# Patient Record
Sex: Female | Born: 1945
Health system: Southern US, Community
[De-identification: ages and names within clinical notes are randomized; demographics above are authoritative.]

## PROBLEM LIST (undated history)

## (undated) DIAGNOSIS — R011 Cardiac murmur, unspecified: Secondary | ICD-10-CM

## (undated) DIAGNOSIS — M1990 Unspecified osteoarthritis, unspecified site: Secondary | ICD-10-CM

## (undated) DIAGNOSIS — G43909 Migraine, unspecified, not intractable, without status migrainosus: Secondary | ICD-10-CM

## (undated) DIAGNOSIS — Z8619 Personal history of other infectious and parasitic diseases: Secondary | ICD-10-CM

## (undated) HISTORY — DX: Unspecified osteoarthritis, unspecified site: M19.90

## (undated) HISTORY — DX: Migraine, unspecified, not intractable, without status migrainosus: G43.909

## (undated) HISTORY — PX: ABDOMINAL HYSTERECTOMY: SHX81

## (undated) HISTORY — PX: FOOT SURGERY: SHX648

## (undated) HISTORY — PX: NECK SURGERY: SHX720

## (undated) HISTORY — DX: Cardiac murmur, unspecified: R01.1

## (undated) HISTORY — PX: APPENDECTOMY: SHX54

## (undated) HISTORY — PX: BACK SURGERY: SHX140

## (undated) HISTORY — PX: REPLACEMENT TOTAL KNEE: SUR1224

## (undated) HISTORY — DX: Personal history of other infectious and parasitic diseases: Z86.19

## (undated) HISTORY — PX: BREAST SURGERY: SHX581

---

## 2016-09-03 DIAGNOSIS — H40033 Anatomical narrow angle, bilateral: Secondary | ICD-10-CM | POA: Diagnosis not present

## 2016-09-03 DIAGNOSIS — H2513 Age-related nuclear cataract, bilateral: Secondary | ICD-10-CM | POA: Diagnosis not present

## 2016-09-06 ENCOUNTER — Ambulatory Visit: Payer: Self-pay | Admitting: Family

## 2016-09-11 ENCOUNTER — Encounter: Payer: Self-pay | Admitting: Family

## 2016-09-11 ENCOUNTER — Ambulatory Visit (INDEPENDENT_AMBULATORY_CARE_PROVIDER_SITE_OTHER): Payer: Medicare Other | Admitting: Family

## 2016-09-11 DIAGNOSIS — M5441 Lumbago with sciatica, right side: Secondary | ICD-10-CM

## 2016-09-11 DIAGNOSIS — M542 Cervicalgia: Secondary | ICD-10-CM

## 2016-09-11 DIAGNOSIS — G43909 Migraine, unspecified, not intractable, without status migrainosus: Secondary | ICD-10-CM | POA: Insufficient documentation

## 2016-09-11 DIAGNOSIS — M545 Low back pain, unspecified: Secondary | ICD-10-CM | POA: Insufficient documentation

## 2016-09-11 DIAGNOSIS — G43009 Migraine without aura, not intractable, without status migrainosus: Secondary | ICD-10-CM

## 2016-09-11 DIAGNOSIS — G8929 Other chronic pain: Secondary | ICD-10-CM

## 2016-09-11 MED ORDER — DULOXETINE HCL 30 MG PO CPEP: ORAL_CAPSULE | ORAL | 1 refills | Status: DC

## 2016-09-11 MED ORDER — DICLOFENAC SODIUM 1 % TD GEL: 2.0000 g | Freq: Four times a day (QID) | TRANSDERMAL | 2 refills | Status: DC

## 2016-09-11 MED ORDER — TRAMADOL HCL 50 MG PO TABS: 50.0000 mg | ORAL_TABLET | Freq: Four times a day (QID) | ORAL | 0 refills | Status: DC | PRN

## 2016-09-11 MED ORDER — TIZANIDINE HCL 4 MG PO TABS: 4.0000 mg | ORAL_TABLET | Freq: Four times a day (QID) | ORAL | 0 refills | Status: DC | PRN

## 2016-09-11 MED ORDER — METHYLPREDNISOLONE ACETATE 80 MG/ML IJ SUSP
80.0000 mg | Freq: Once | INTRAMUSCULAR | Status: AC
  Administered 2016-09-11: 80 mg via INTRAMUSCULAR

## 2016-09-11 MED ORDER — KETOROLAC TROMETHAMINE 60 MG/2ML IM SOLN
60.0000 mg | Freq: Once | INTRAMUSCULAR | Status: AC
  Administered 2016-09-11: 60 mg via INTRAMUSCULAR

## 2016-09-11 NOTE — Assessment & Plan Note (Signed)
Migraine headaches appear adequately controlled and frequent. Information discussed regarding possible triggers. Encouraged to monitor signs and symptoms in relation to possible trigger factors. Denies worse headache of life Continue over-the-counter medications as needed for symptom relief and supportive care.

## 2016-09-11 NOTE — Patient Instructions (Addendum)
Thank you for choosing [facility].  SUMMARY AND INSTRUCTIONS:  Ice and moist heat x 20 minutes every other hour and after activity.  Stretches and activities multiple times throughout the day.  Increase physical activity with at least walking.   Medication:  Start taking the Cymbalta to help with pain. Start with 30 mg daily for 1 week then increase to 60 mg daily.  Start Tramadol as needed for pain.  Start Voltaren gel as needed.   Your prescription(s) have been submitted to your pharmacy or been printed and provided for you. Please take as directed and contact our office if you believe you are having problem(s) with the medication(s) or have any questions.  Labs:  Please stop by the lab on the lower level of the building for your blood work. Your results will be released to MyChart (or called to you) after review, usually within 72 hours after test completion. If any changes need to be made, you will be notified at that same time.  1.) The lab is open from 7:30am to 5:30 pm Monday-Friday 2.) No appointment is necessary 3.) Fasting (if needed) is 6-8 hours after food and drink; black coffee and water are okay   Imaging / Radiology:  Please stop by radiology on the basement level of the building for your x-rays. Your results will be released to MyChart (or called to you) after review, usually within 72 hours after test completion. If any treatments or changes are necessary, you will be notified at that same time.  Referrals:  Referrals have been made during this visit. You should expect to hear back from our schedulers in about 7-10 days in regards to establishing an appointment with the specialists we discussed.   Follow up:  If your symptoms worsen or fail to improve, please contact our office for further instruction, or in case of emergency go directly to the emergency room at the closest medical facility.     Cervical Strain and Sprain Rehab Ask your health care  provider which exercises are safe for you. Do exercises exactly as told by your health care provider and adjust them as directed. It is normal to feel mild stretching, pulling, tightness, or discomfort as you do these exercises, but you should stop right away if you feel sudden pain or your pain gets worse.Do not begin these exercises until told by your health care provider. Stretching and range of motion exercises These exercises warm up your muscles and joints and improve the movement and flexibility of your neck. These exercises also help to relieve pain, numbness, and tingling. Exercise A: Cervical side bend 1. Using good posture, sit on a stable chair or stand up. 2. Without moving your shoulders, slowly tilt your left / right ear to your shoulder until you feel a stretch in your neck muscles. You should be looking straight ahead. 3. Hold for __________ seconds. 4. Repeat with the other side of your neck. Repeat __________ times. Complete this exercise __________ times a day. Exercise B: Cervical rotation 1. Using good posture, sit on a stable chair or stand up. 2. Slowly turn your head to the side as if you are looking over your left / right shoulder.  Keep your eyes level with the ground.  Stop when you feel a stretch along the side and the back of your neck. 3. Hold for __________ seconds. 4. Repeat this by turning to your other side. Repeat __________ times. Complete this exercise __________ times a day. Exercise C:  Thoracic extension and pectoral stretch 1. Roll a towel or a small blanket so it is about 4 inches (10 cm) in diameter. 2. Lie down on your back on a firm surface. 3. Put the towel lengthwise, under your spine in the middle of your back. It should not be not under your shoulder blades. The towel should line up with your spine from your middle back to your lower back. 4. Put your hands behind your head and let your elbows fall out to your sides. 5. Hold for __________  seconds. Repeat __________ times. Complete this exercise __________ times a day. Strengthening exercises These exercises build strength and endurance in your neck. Endurance is the ability to use your muscles for a long time, even after your muscles get tired. Exercise D: Upper cervical flexion, isometric 1. Lie on your back with a thin pillow behind your head and a small rolled-up towel under your neck. 2. Gently tuck your chin toward your chest and nod your head down to look toward your feet. Do not lift your head off the pillow. 3. Hold for __________ seconds. 4. Release the tension slowly. Relax your neck muscles completely before you repeat this exercise. Repeat __________ times. Complete this exercise __________ times a day. Exercise E: Cervical extension, isometric 1. Stand about 6 inches (15 cm) away from a wall, with your back facing the wall. 2. Place a soft object, about 6-8 inches (15-20 cm) in diameter, between the back of your head and the wall. A soft object could be a small pillow, a ball, or a folded towel. 3. Gently tilt your head back and press into the soft object. Keep your jaw and forehead relaxed. 4. Hold for __________ seconds. 5. Release the tension slowly. Relax your neck muscles completely before you repeat this exercise. Repeat __________ times. Complete this exercise __________ times a day. Posture and body mechanics   Body mechanics refers to the movements and positions of your body while you do your daily activities. Posture is part of body mechanics. Good posture and healthy body mechanics can help to relieve stress in your body's tissues and joints. Good posture means that your spine is in its natural S-curve position (your spine is neutral), your shoulders are pulled back slightly, and your head is not tipped forward. The following are general guidelines for applying improved posture and body mechanics to your everyday activities. Standing  When standing, keep  your spine neutral and keep your feet about hip-width apart. Keep a slight bend in your knees. Your ears, shoulders, and hips should line up.  When you do a task in which you stand in one place for a long time, place one foot up on a stable object that is 2-4 inches (5-10 cm) high, such as a footstool. This helps keep your spine neutral. Sitting  When sitting, keep your spine neutral and your keep feet flat on the floor. Use a footrest, if necessary, and keep your thighs parallel to the floor. Avoid rounding your shoulders, and avoid tilting your head forward.  When working at a desk or a computer, keep your desk at a height where your hands are slightly lower than your elbows. Slide your chair under your desk so you are close enough to maintain good posture.  When working at a computer, place your monitor at a height where you are looking straight ahead and you do not have to tilt your head forward or downward to look at the screen. Resting When lying  down and resting, avoid positions that are most painful for you. Try to support your neck in a neutral position. You can use a contour pillow or a small rolled-up towel. Your pillow should support your neck but not push on it. This information is not intended to replace advice given to you by your health care provider. Make sure you discuss any questions you have with your health care provider. Document Released: 08/06/2005 Document Revised: 04/12/2016 Document Reviewed: 07/13/2015 Elsevier Interactive Patient Education  2017 [employer]   Low Back Strain Rehab Ask your health care provider which exercises are safe for you. Do exercises exactly as told by your health care provider and adjust them as directed. It is normal to feel mild stretching, pulling, tightness, or discomfort as you do these exercises, but you should stop right away if you feel sudden pain or your pain gets worse. Do not begin these exercises until told by your health care  provider. Stretching and range of motion exercises These exercises warm up your muscles and joints and improve the movement and flexibility of your back. These exercises also help to relieve pain, numbness, and tingling. Exercise A: Single knee to chest 1. Lie on your back on a firm surface with both legs straight. 2. Bend one of your knees. Use your hands to move your knee up toward your chest until you feel a gentle stretch in your lower back and buttock.  Hold your leg in this position by holding onto the front of your knee.  Keep your other leg as straight as possible. 3. Hold for __________ seconds. 4. Slowly return to the starting position. 5. Repeat with your other leg. Repeat __________ times. Complete this exercise __________ times a day. Exercise B: Prone extension on elbows 5. Lie on your abdomen on a firm surface. 6. Prop yourself up on your elbows. 7. Use your arms to help lift your chest up until you feel a gentle stretch in your abdomen and your lower back.  This will place some of your body weight on your elbows. If this is uncomfortable, try stacking pillows under your chest.  Your hips should stay down, against the surface that you are lying on. Keep your hip and back muscles relaxed. 8. Hold for __________ seconds. 9. Slowly relax your upper body and return to the starting position. Repeat __________ times. Complete this exercise __________ times a day. Strengthening exercises These exercises build strength and endurance in your back. Endurance is the ability to use your muscles for a long time, even after they get tired. Exercise C: Pelvic tilt 1. Lie on your back on a firm surface. Bend your knees and keep your feet flat. 2. Tense your abdominal muscles. Tip your pelvis up toward the ceiling and flatten your lower back into the floor.  To help with this exercise, you may place a small towel under your lower back and try to push your back into the towel. 3. Hold  for __________ seconds. 4. Let your muscles relax completely before you repeat this exercise. Repeat __________ times. Complete this exercise __________ times a day. Exercise D: Alternating arm and leg raises 1. Get on your hands and knees on a firm surface. If you are on a hard floor, you may want to use padding to cushion your knees, such as an exercise mat. 2. Line up your arms and legs. Your hands should be below your shoulders, and your knees should be below your hips. 3. Lift your left  leg behind you. At the same time, raise your right arm and straighten it in front of you.  Do not lift your leg higher than your hip.  Do not lift your arm higher than your shoulder.  Keep your abdominal and back muscles tight.  Keep your hips facing the ground.  Do not arch your back.  Keep your balance carefully, and do not hold your breath. 4. Hold for __________ seconds. 5. Slowly return to the starting position and repeat with your right leg and your left arm. Repeat __________ times. Complete this exercise __________times a day. Exercise J: Single leg lower with bent knees 1. Lie on your back on a firm surface. 2. Tense your abdominal muscles and lift your feet off the floor, one foot at a time, so your knees and hips are bent in an "L" shape (at about 90 degrees).  Your knees should be over your hips and your lower legs should be parallel to the floor. 3. Keeping your abdominal muscles tense and your knee bent, slowly lower one of your legs so your toe touches the ground. 4. Lift your leg back up to return to the starting position.  Do not hold your breath.  Do not let your back arch. Keep your back flat against the ground. 5. Repeat with your other leg. Repeat __________ times. Complete this exercise __________ times a day. Posture and body mechanics   Body mechanics refers to the movements and positions of your body while you do your daily activities. Posture is part of body  mechanics. Good posture and healthy body mechanics can help to relieve stress in your body's tissues and joints. Good posture means that your spine is in its natural S-curve position (your spine is neutral), your shoulders are pulled back slightly, and your head is not tipped forward. The following are general guidelines for applying improved posture and body mechanics to your everyday activities. Standing   When standing, keep your spine neutral and your feet about hip-width apart. Keep a slight bend in your knees. Your ears, shoulders, and hips should line up.  When you do a task in which you stand in one place for a long time, place one foot up on a stable object that is 2-4 inches (5-10 cm) high, such as a footstool. This helps keep your spine neutral. Sitting  When sitting, keep your spine neutral and keep your feet flat on the floor. Use a footrest, if necessary, and keep your thighs parallel to the floor. Avoid rounding your shoulders, and avoid tilting your head forward.  When working at a desk or a computer, keep your desk at a height where your hands are slightly lower than your elbows. Slide your chair under your desk so you are close enough to maintain good posture.  When working at a computer, place your monitor at a height where you are looking straight ahead and you do not have to tilt your head forward or downward to look at the screen. Resting   When lying down and resting, avoid positions that are most painful for you.  If you have pain with activities such as sitting, bending, stooping, or squatting (flexion-based activities), lie in a position in which your body does not bend very much. For example, avoid curling up on your side with your arms and knees near your chest (fetal position).  If you have pain with activities such as standing for a long time or reaching with your arms (extension-based activities), lie  with your spine in a neutral position and bend your knees  slightly. Try the following positions:  Lying on your side with a pillow between your knees.  Lying on your back with a pillow under your knees. Lifting   When lifting objects, keep your feet at least shoulder-width apart and tighten your abdominal muscles.  Bend your knees and hips and keep your spine neutral. It is important to lift using the strength of your legs, not your back. Do not lock your knees straight out.  Always ask for help to lift heavy or awkward objects. This information is not intended to replace advice given to you by your health care provider. Make sure you discuss any questions you have with your health care provider. Document Released: 08/06/2005 Document Revised: 04/12/2016 Document Reviewed: 05/18/2015 Elsevier Interactive Patient Education  2017 [employer].

## 2016-09-11 NOTE — Assessment & Plan Note (Addendum)
Low back pain appears to be paraspinal musculature secondary to tightness and decreased mobility. In office injection of 60 mg of Toradol and 80 mg of Depo-Medrol provided. Recommended increasing physical activity to 30 minutes of moderate level activity daily. Start tizanidine as needed for muscle spasm. Start Cymbalta for chronic pain and neuropathies. Start Voltaren gel as needed. If symptoms worsen or do not improve consider additional imaging and/or formal physical therapy. Patient reiterated on several occasions that the only thing that helps her pain as Percocet and fentanyl patches.

## 2016-09-11 NOTE — Assessment & Plan Note (Signed)
Chronic neck pain status post fusion surgery with severely limited range of motion and lateral bending and rotation. This is most likely related to decreased physical activity. Treat conservatively with ice, home exercise therapy, and start tramadol as needed for pain. Start Cymbalta. Start Zanaflex as needed for muscle spasms. Follow-up in 3 weeks or sooner if symptoms do not improve for possible further imaging and/or referral to pain management.

## 2016-09-11 NOTE — Progress Notes (Signed)
Subjective:    Patient ID: Gina Higgins, female    DOB: 13-Nov-1945, 71 y.o.   MRN: [id number]  Chief Complaint  Patient presents with  . Establish Care    states she has body pain, it has been going on for a while    HPI:  Gina Higgins is a 71 y.o. female who  has a past medical history of Arthritis; Heart murmur; History of chicken pox; and Migraines. and presents today for an office visit to establish care.   1.) Body pain - Associated symptom of pain located generalized throughout her body has been going on for at least a year. Pain is worst at her neck and described as achy and notes that she has decreased range of motion. Modifying factors include taking another person's prescription Percocet which seems to help. She has also used Voltaren cream which seemed to help a little as well. Last physical therapy was years ago. Her back pain is described as sharp at times and generalized and achy.  Pain can radiate down her right leg. Has had previous surgery. Pain severity is enough to effect her activities of daily living, however she is able to complete them at a slower pace. Has limited exercise. Previous medications include Fentanyl patches which help on occasion   2.) Migraine headaches - Previously diagnosed with migraine headaches which she indicates are stable presently with a frequency of 1x every 3 months. Described as achy with sensitivity to light and sound without nausea or vomiting. Denies worst headache of life. No aura before headaches. There are no trigger factors that she is aware of. Modifying factors include Excedrin.  Allergies  Allergen Reactions  . Compazine [Prochlorperazine Edisylate]     Lock jaw     No outpatient prescriptions prior to visit.   No facility-administered medications prior to visit.      Past Medical History:  Diagnosis Date  . Arthritis   . Heart murmur   . History of chicken pox   . Migraines       Past Surgical History:    Procedure Laterality Date  . ABDOMINAL HYSTERECTOMY     Partial  . APPENDECTOMY    . BACK SURGERY    . BREAST SURGERY     Total mastectomy  . FOOT SURGERY    . NECK SURGERY    . REPLACEMENT TOTAL KNEE Right       Family History  Problem Relation Age of Onset  . Hypertension Mother   . Breast cancer Mother   . Gout Father   . Hypertension Father   . Hypertension Maternal Grandmother   . Hypertension Maternal Grandfather       Social History   Social History  . Marital status: Single    Spouse name: N/A  . Number of children: 4  . Years of education: 12   Occupational History  . Retired    Social History Main Topics  . Smoking status: Former Smoker    Packs/day: 1.00    Years: 50.00    Types: Cigarettes    Quit date: 08/13/2016  . Smokeless tobacco: Never Used  . Alcohol use 1.8 oz/week    2 Glasses of wine, 1 Cans of beer per week  . Drug use: Unknown  . Sexual activity: Not on file   Other Topics Concern  . Not on file   Social History Narrative   Fun: "Can't do anything because of pain." Likes to bowl  Review of Systems  Constitutional: Negative for chills and fever.  Respiratory: Negative for chest tightness and shortness of breath.   Cardiovascular: Negative for chest pain, palpitations and leg swelling.  Musculoskeletal: Positive for arthralgias, back pain, myalgias, neck pain and neck stiffness.  Neurological: Positive for headaches. Negative for weakness and numbness.       Objective:    BP (!) 146/84 (BP Location: Left Arm, Patient Position: Sitting, Cuff Size: Normal)   Temp 98.6 F (37 C) (Oral)   Resp 14   Ht 5\' 4"  (1.626 m)   Wt 148 lb (67.1 kg)   BMI 25.40 kg/m  Nursing note and vital signs reviewed.  Physical Exam  Constitutional: She is oriented to person, place, and time. She appears well-developed and well-nourished. No distress.  Neck:  Deformity, discoloration, or edema. There is significant tenderness of the  bilateral paraspinal musculature. Range of motion is severely restricted and lateral bending and rotation. Distal pulses and sensation are intact and appropriate. Negative cervical compression test.  Cardiovascular: Normal rate, regular rhythm, normal heart sounds and intact distal pulses.   Pulmonary/Chest: Effort normal and breath sounds normal.  Musculoskeletal:  Low back - no obvious deformity, discoloration, or edema. Palpable tenderness along lumbar spinous processes of L3-L5 and corresponding paraspinal musculature. No crepitus or deformity noted. There is significant decreased range of motion in forward flexion and rotation. Distal pulses and sensation are intact and appropriate. Negative straight leg raise. Unable to complete Pearlean Brownie secondary to range of motion.   Neurological: She is alert and oriented to person, place, and time.  Skin: Skin is warm and dry.  Psychiatric: She has a normal mood and affect. Her behavior is normal. Judgment and thought content normal.        Assessment & Plan:   Problem List Items Addressed This Visit      Cardiovascular and Mediastinum   Migraine    Migraine headaches appear adequately controlled and frequent. Information discussed regarding possible triggers. Encouraged to monitor signs and symptoms in relation to possible trigger factors. Denies worse headache of life Continue over-the-counter medications as needed for symptom relief and supportive care.      Relevant Medications   traMADol (ULTRAM) 50 MG tablet   tiZANidine (ZANAFLEX) 4 MG tablet   DULoxetine (CYMBALTA) 30 MG capsule   ketorolac (TORADOL) injection 60 mg (Completed)     Other   Chronic neck pain    Chronic neck pain status post fusion surgery with severely limited range of motion and lateral bending and rotation. This is most likely related to decreased physical activity. Treat conservatively with ice, home exercise therapy, and start tramadol as needed for pain. Start  Cymbalta. Start Zanaflex as needed for muscle spasms. Follow-up in 3 weeks or sooner if symptoms do not improve for possible further imaging and/or referral to pain management.       Relevant Medications   traMADol (ULTRAM) 50 MG tablet   tiZANidine (ZANAFLEX) 4 MG tablet   DULoxetine (CYMBALTA) 30 MG capsule   diclofenac sodium (VOLTAREN) 1 % GEL   methylPREDNISolone acetate (DEPO-MEDROL) injection 80 mg (Completed)   ketorolac (TORADOL) injection 60 mg (Completed)   Low back pain    Low back pain appears to be paraspinal musculature secondary to tightness and decreased mobility. In office injection of 60 mg of Toradol and 80 mg of Depo-Medrol provided. Recommended increasing physical activity to 30 minutes of moderate level activity daily. Start tizanidine as needed for muscle spasm. Start Cymbalta  for chronic pain and neuropathies. Start Voltaren gel as needed. If symptoms worsen or do not improve consider additional imaging and/or formal physical therapy. Patient reiterated on several occasions that the only thing that helps her pain as Percocet and fentanyl patches.      Relevant Medications   traMADol (ULTRAM) 50 MG tablet   tiZANidine (ZANAFLEX) 4 MG tablet   DULoxetine (CYMBALTA) 30 MG capsule   diclofenac sodium (VOLTAREN) 1 % GEL   methylPREDNISolone acetate (DEPO-MEDROL) injection 80 mg (Completed)   ketorolac (TORADOL) injection 60 mg (Completed)       I am having Gina Higgins start on traMADol. I am also having her maintain her tiZANidine, DULoxetine, and diclofenac sodium. We administered methylPREDNISolone acetate and ketorolac.   Meds ordered this encounter  Medications  . DISCONTD: DULoxetine (CYMBALTA) 30 MG capsule    Sig: Take 1 tablet by mouth daily for 1 week and then increase to 2 tablets daily.    Dispense:  60 capsule    Refill:  1    Order Specific Question:   Supervising Provider    Answer:   Hillard Danker A [[id number]]  . DISCONTD: tiZANidine (ZANAFLEX)  4 MG tablet    Sig: Take 1 tablet (4 mg total) by mouth every 6 (six) hours as needed for muscle spasms.    Dispense:  90 tablet    Refill:  0    Order Specific Question:   Supervising Provider    Answer:   Hillard Danker A [[id number]]  . traMADol (ULTRAM) 50 MG tablet    Sig: Take 1-2 tablets (50-100 mg total) by mouth every 6 (six) hours as needed.    Dispense:  120 tablet    Refill:  0    Order Specific Question:   Supervising Provider    Answer:   Hillard Danker A [[id number]]  . DISCONTD: diclofenac sodium (VOLTAREN) 1 % GEL    Sig: Apply 2 g topically 4 (four) times daily.    Dispense:  100 g    Refill:  2    Order Specific Question:   Supervising Provider    Answer:   Hillard Danker A [[id number]]  . tiZANidine (ZANAFLEX) 4 MG tablet    Sig: Take 1 tablet (4 mg total) by mouth every 6 (six) hours as needed for muscle spasms.    Dispense:  90 tablet    Refill:  0    Order Specific Question:   Supervising Provider    Answer:   Hillard Danker A [[id number]]  . DULoxetine (CYMBALTA) 30 MG capsule    Sig: Take 1 tablet by mouth daily for 1 week and then increase to 2 tablets daily.    Dispense:  60 capsule    Refill:  1    Order Specific Question:   Supervising Provider    Answer:   Hillard Danker A [[id number]]  . diclofenac sodium (VOLTAREN) 1 % GEL    Sig: Apply 2 g topically 4 (four) times daily.    Dispense:  100 g    Refill:  2    Order Specific Question:   Supervising Provider    Answer:   Hillard Danker A [[id number]]  . methylPREDNISolone acetate (DEPO-MEDROL) injection 80 mg  . ketorolac (TORADOL) injection 60 mg     Follow-up: Return in about 1 month (around 10/12/2016), or if symptoms worsen or fail to improve.  Jeanine Luz, FNP

## 2016-09-17 ENCOUNTER — Telehealth: Payer: Self-pay

## 2016-09-17 NOTE — Telephone Encounter (Signed)
PA initiated for diclofenac gel. Waiting approval

## 2016-10-12 ENCOUNTER — Encounter: Payer: Self-pay | Admitting: Family

## 2016-10-12 ENCOUNTER — Ambulatory Visit (INDEPENDENT_AMBULATORY_CARE_PROVIDER_SITE_OTHER): Payer: Medicare Other | Admitting: Family

## 2016-10-12 DIAGNOSIS — G8929 Other chronic pain: Secondary | ICD-10-CM | POA: Diagnosis not present

## 2016-10-12 DIAGNOSIS — M5441 Lumbago with sciatica, right side: Secondary | ICD-10-CM | POA: Diagnosis not present

## 2016-10-12 DIAGNOSIS — M542 Cervicalgia: Secondary | ICD-10-CM

## 2016-10-12 MED ORDER — ACETAMINOPHEN-CODEINE #3 300-30 MG PO TABS: 1.0000 | ORAL_TABLET | Freq: Four times a day (QID) | ORAL | 0 refills | Status: DC | PRN

## 2016-10-12 MED ORDER — DULOXETINE HCL 60 MG PO CPEP: ORAL_CAPSULE | ORAL | 0 refills | Status: DC

## 2016-10-12 NOTE — Assessment & Plan Note (Signed)
Continues to experience low back pain that is refractory to tramadol which she expresses itching is now place on her allergy list. Indicates she has been compliant with home exercise therapy, ice, and heat. Describes decreased mobility with current pain rating 9/10. Continues to request opioid medications and also became irate that her pain was not being treated. Noted to be contradictory with discussion of Lyrica indicating she had not taking it and then stating she had taken it. Continue conservative treatment with ice/moist, home exercise therapy and increase Cymbalta and start Tylenol 3. Recommend physical therapy which patient indicates she cannot afford. Discussed risks associated with opioid medications. Continue to monitor pending trial of new medications.

## 2016-10-12 NOTE — Patient Instructions (Addendum)
Thank you for choosing [facility].  SUMMARY AND INSTRUCTIONS:  Please start Tylenol #3 and increase Cymbalta.  Continue with home exercise therapy.  A referral has been made to pain management.  Please obtain old records for review.   Medication:  Your prescription(s) have been submitted to your pharmacy or been printed and provided for you. Please take as directed and contact our office if you believe you are having problem(s) with the medication(s) or have any questions.  Follow up:  If your symptoms worsen or fail to improve, please contact our office for further instruction, or in case of emergency go directly to the emergency room at the closest medical facility.

## 2016-10-12 NOTE — Progress Notes (Signed)
Subjective:    Patient ID: Gina Higgins, female    DOB: 07/06/1946, 71 y.o.   MRN: [id number]  Chief Complaint  Patient presents with  . Follow-up    still having body pain and is allergic to tramadol    HPI:  Gina Higgins is a 71 y.o. female who  has a past medical history of Arthritis; Heart murmur; History of chicken pox; and Migraines. and presents today for a follow up office visit.   Chronic neck and back pain - Recently started on Cymbalta and Zanaflex. Reports taking the medication as prescribed and denies adverse side effects. Reports also taking the Tramadol which resulted in her scratching and has been placed on her allergies list. Notes that she has been doing the exercises and ice and heat. Notes that her whole body "reaks with pain."  Inidicates that she has decreased ability to complete her activities of daily living but needs assistance on occasion. Doing pretty good with stretches and exercises. Pains are described as achy and sharp on occasion with a severity of 9/10. Reports that she has been on gabapentin in the past and does nothing. th  Allergies  Allergen Reactions  . Compazine [Prochlorperazine Edisylate]     Lock jaw  . Tramadol Itching      Outpatient Medications Prior to Visit  Medication Sig Dispense Refill  . diclofenac sodium (VOLTAREN) 1 % GEL Apply 2 g topically 4 (four) times daily. 100 g 2  . tiZANidine (ZANAFLEX) 4 MG tablet Take 1 tablet (4 mg total) by mouth every 6 (six) hours as needed for muscle spasms. 90 tablet 0  . DULoxetine (CYMBALTA) 30 MG capsule Take 1 tablet by mouth daily for 1 week and then increase to 2 tablets daily. 60 capsule 1  . traMADol (ULTRAM) 50 MG tablet Take 1-2 tablets (50-100 mg total) by mouth every 6 (six) hours as needed. 120 tablet 0   No facility-administered medications prior to visit.       Past Surgical History:  Procedure Laterality Date  . ABDOMINAL HYSTERECTOMY     Partial  . APPENDECTOMY      . BACK SURGERY    . BREAST SURGERY     Total mastectomy  . FOOT SURGERY    . NECK SURGERY    . REPLACEMENT TOTAL KNEE Right       Past Medical History:  Diagnosis Date  . Arthritis   . Heart murmur   . History of chicken pox   . Migraines       Review of Systems  Constitutional: Negative for chills and fever.  Respiratory: Negative for chest tightness and shortness of breath.   Musculoskeletal: Positive for back pain, neck pain and neck stiffness.  Neurological: Negative for weakness and numbness.      Objective:    BP 108/78 (BP Location: Left Arm, Patient Position: Sitting, Cuff Size: Normal)   Pulse 73   Temp 98.4 F (36.9 C) (Oral)   Resp 18   Ht 5\' 4"  (1.626 m)   Wt 151 lb (68.5 kg)   SpO2 90%   BMI 25.92 kg/m  Nursing note and vital signs reviewed.  Physical Exam  Constitutional: She is oriented to person, place, and time. She appears well-developed and well-nourished. No distress.  Cardiovascular: Normal rate, regular rhythm, normal heart sounds and intact distal pulses.   Pulmonary/Chest: Effort normal and breath sounds normal.  Musculoskeletal:  Chronic neck/back pain - ambulated around the office well and seated in  the chair in no apparent distress. Exams otherwise unchanged since previous.  Neurological: She is alert and oriented to person, place, and time.  Skin: Skin is warm and dry.  Psychiatric: She has a normal mood and affect. Her behavior is normal. Judgment and thought content normal.       Assessment & Plan:   Problem List Items Addressed This Visit      Other   Chronic neck pain    See discussion under low back pain. Referral placed to pain management for further assessment and treatment. Continue conservative treatment with ice, moist heat, increased Cymbalta and start Tylenol 3. Declines physical therapy. Attempting to obtain previous records. Continue to monitor.      Relevant Medications   acetaminophen-codeine (TYLENOL #3)  300-30 MG tablet   DULoxetine (CYMBALTA) 60 MG capsule   Other Relevant Orders   Ambulatory referral to Pain Clinic   Low back pain    Continues to experience low back pain that is refractory to tramadol which she expresses itching is now place on her allergy list. Indicates she has been compliant with home exercise therapy, ice, and heat. Describes decreased mobility with current pain rating 9/10. Continues to request opioid medications and also became irate that her pain was not being treated. Noted to be contradictory with discussion of Lyrica indicating she had not taking it and then stating she had taken it. Continue conservative treatment with ice/moist, home exercise therapy and increase Cymbalta and start Tylenol 3. Recommend physical therapy which patient indicates she cannot afford. Discussed risks associated with opioid medications. Continue to monitor pending trial of new medications.      Relevant Medications   acetaminophen-codeine (TYLENOL #3) 300-30 MG tablet   DULoxetine (CYMBALTA) 60 MG capsule   Other Relevant Orders   Ambulatory referral to Pain Clinic       I have discontinued Ms. Karras's traMADol. I have also changed her DULoxetine. Additionally, I am having her start on acetaminophen-codeine. Lastly, I am having her maintain her tiZANidine and diclofenac sodium.   Meds ordered this encounter  Medications  . acetaminophen-codeine (TYLENOL #3) 300-30 MG tablet    Sig: Take 1-2 tablets by mouth every 6 (six) hours as needed for moderate pain.    Dispense:  112 tablet    Refill:  0    Order Specific Question:   Supervising Provider    Answer:   Hillard DankerRAWFORD, ELIZABETH A [[id number]]  . DULoxetine (CYMBALTA) 60 MG capsule    Sig: Take 1 tablet by mouth daily for 1 week and then increase to 2 tablets daily.    Dispense:  60 capsule    Refill:  0    Order Specific Question:   Supervising Provider    Answer:   Hillard DankerRAWFORD, ELIZABETH A [[id number]]     Follow-up: Return in about 1  month (around 11/09/2016), or if symptoms worsen or fail to improve.  Jeanine Luzalone, Gregory, FNP

## 2016-10-12 NOTE — Assessment & Plan Note (Addendum)
See discussion under low back pain. Referral placed to pain management for further assessment and treatment. Continue conservative treatment with ice, moist heat, increased Cymbalta and start Tylenol 3. Declines physical therapy. Attempting to obtain previous records. Continue to monitor.

## 2016-11-07 ENCOUNTER — Encounter: Payer: Self-pay | Admitting: Physical Medicine & Rehabilitation

## 2016-11-09 ENCOUNTER — Ambulatory Visit (INDEPENDENT_AMBULATORY_CARE_PROVIDER_SITE_OTHER): Payer: Medicare Other | Admitting: Family

## 2016-11-09 ENCOUNTER — Encounter: Payer: Self-pay | Admitting: Family

## 2016-11-09 DIAGNOSIS — M542 Cervicalgia: Secondary | ICD-10-CM

## 2016-11-09 DIAGNOSIS — G8929 Other chronic pain: Secondary | ICD-10-CM

## 2016-11-09 DIAGNOSIS — M5441 Lumbago with sciatica, right side: Secondary | ICD-10-CM | POA: Diagnosis not present

## 2016-11-09 MED ORDER — TIZANIDINE HCL 4 MG PO TABS: 4.0000 mg | ORAL_TABLET | Freq: Three times a day (TID) | ORAL | 0 refills | Status: DC | PRN

## 2016-11-09 NOTE — Assessment & Plan Note (Signed)
Continues to experience chronic neck pain that is refractory to the current medication regimen. See discussion for low back pain. Follow up with pain management.

## 2016-11-09 NOTE — Assessment & Plan Note (Signed)
Continues to experience low back pain that is refractory to current medication regimen with pain levels at 8-9/10. DIRE score of 12 with concern for narcotic usage. Patient openly admits that she is using her daughter's fentanyl patches and given urine sample would test positive. This is violation of our controlled substance policy and no further controlled substances will be prescribed given the increased potential for abuse and misuse. She will continue with Tizandine and non-pharmacological treatments with pended referral to pain management.
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
## 2016-11-09 NOTE — Patient Instructions (Addendum)
Thank you for choosing [facility].  SUMMARY AND INSTRUCTIONS:  Medication:  Please increase your Zanaflex.   Your prescription(s) have been submitted to your pharmacy or been printed and provided for you. Please take as directed and contact our office if you believe you are having problem(s) with the medication(s) or have any questions.  Follow up:  If your symptoms worsen or fail to improve, please contact our office for further instruction, or in case of emergency go directly to the emergency room at the closest medical facility.     Low Back Sprain Rehab Ask your health care provider which exercises are safe for you. Do exercises exactly as told by your health care provider and adjust them as directed. It is normal to feel mild stretching, pulling, tightness, or discomfort as you do these exercises, but you should stop right away if you feel sudden pain or your pain gets worse. Do not begin these exercises until told by your health care provider. Stretching and range of motion exercises These exercises warm up your muscles and joints and improve the movement and flexibility of your back. These exercises also help to relieve pain, numbness, and tingling. Exercise A: Lumbar rotation   1. Lie on your back on a firm surface and bend your knees. 2. Straighten your arms out to your sides so each arm forms an "L" shape with a side of your body (a 90 degree angle). 3. Slowly move both of your knees to one side of your body until you feel a stretch in your lower back. Try not to let your shoulders move off of the floor. 4. Hold for __________ seconds. 5. Tense your abdominal muscles and slowly move your knees back to the starting position. 6. Repeat this exercise on the other side of your body. Repeat __________ times. Complete this exercise __________ times a day. Exercise B: Prone extension on elbows   1. Lie on your abdomen on a firm surface. 2. Prop yourself up on your  elbows. 3. Use your arms to help lift your chest up until you feel a gentle stretch in your abdomen and your lower back.  This will place some of your body weight on your elbows. If this is uncomfortable, try stacking pillows under your chest.  Your hips should stay down, against the surface that you are lying on. Keep your hip and back muscles relaxed. 4. Hold for __________ seconds. 5. Slowly relax your upper body and return to the starting position. Repeat __________ times. Complete this exercise __________ times a day. Strengthening exercises These exercises build strength and endurance in your back. Endurance is the ability to use your muscles for a long time, even after they get tired. Exercise C: Pelvic tilt  1. Lie on your back on a firm surface. Bend your knees and keep your feet flat. 2. Tense your abdominal muscles. Tip your pelvis up toward the ceiling and flatten your lower back into the floor.  To help with this exercise, you may place a small towel under your lower back and try to push your back into the towel. 3. Hold for __________ seconds. 4. Let your muscles relax completely before you repeat this exercise. Repeat __________ times. Complete this exercise __________ times a day. Exercise D: Alternating arm and leg raises   1. Get on your hands and knees on a firm surface. If you are on a hard floor, you may want to use padding to cushion your knees, such as an exercise  mat. 2. Line up your arms and legs. Your hands should be below your shoulders, and your knees should be below your hips. 3. Lift your left leg behind you. At the same time, raise your right arm and straighten it in front of you.  Do not lift your leg higher than your hip.  Do not lift your arm higher than your shoulder.  Keep your abdominal and back muscles tight.  Keep your hips facing the ground.  Do not arch your back.  Keep your balance carefully, and do not hold your breath. 4. Hold for  __________ seconds. 5. Slowly return to the starting position and repeat with your right leg and your left arm. Repeat __________ times. Complete this exercise __________ times a day. Exercise E: Abdominal set with straight leg raise   1. Lie on your back on a firm surface. 2. Bend one of your knees and keep your other leg straight. 3. Tense your abdominal muscles and lift your straight leg up, 4-6 inches (10-15 cm) off the ground. 4. Keep your abdominal muscles tight and hold for __________ seconds.  Do not hold your breath.  Do not arch your back. Keep it flat against the ground. 5. Keep your abdominal muscles tense as you slowly lower your leg back to the starting position. 6. Repeat with your other leg. Repeat __________ times. Complete this exercise __________ times a day. Posture and body mechanics   Body mechanics refers to the movements and positions of your body while you do your daily activities. Posture is part of body mechanics. Good posture and healthy body mechanics can help to relieve stress in your body's tissues and joints. Good posture means that your spine is in its natural S-curve position (your spine is neutral), your shoulders are pulled back slightly, and your head is not tipped forward. The following are general guidelines for applying improved posture and body mechanics to your everyday activities. Standing    When standing, keep your spine neutral and your feet about hip-width apart. Keep a slight bend in your knees. Your ears, shoulders, and hips should line up.  When you do a task in which you stand in one place for a long time, place one foot up on a stable object that is 2-4 inches (5-10 cm) high, such as a footstool. This helps keep your spine neutral. Sitting    When sitting, keep your spine neutral and keep your feet flat on the floor. Use a footrest, if necessary, and keep your thighs parallel to the floor. Avoid rounding your shoulders, and avoid tilting  your head forward.  When working at a desk or a computer, keep your desk at a height where your hands are slightly lower than your elbows. Slide your chair under your desk so you are close enough to maintain good posture.  When working at a computer, place your monitor at a height where you are looking straight ahead and you do not have to tilt your head forward or downward to look at the screen. Resting    When lying down and resting, avoid positions that are most painful for you.  If you have pain with activities such as sitting, bending, stooping, or squatting (flexion-based activities), lie in a position in which your body does not bend very much. For example, avoid curling up on your side with your arms and knees near your chest (fetal position).  If you have pain with activities such as standing for a long time or  reaching with your arms (extension-based activities), lie with your spine in a neutral position and bend your knees slightly. Try the following positions:  Lying on your side with a pillow between your knees.  Lying on your back with a pillow under your knees. Lifting    When lifting objects, keep your feet at least shoulder-width apart and tighten your abdominal muscles.  Bend your knees and hips and keep your spine neutral. It is important to lift using the strength of your legs, not your back. Do not lock your knees straight out.  Always ask for help to lift heavy or awkward objects. This information is not intended to replace advice given to you by your health care provider. Make sure you discuss any questions you have with your health care provider. Document Released: 08/06/2005 Document Revised: 04/12/2016 Document Reviewed: 05/18/2015 Elsevier Interactive Patient Education  2017 [employer].

## 2016-11-29 ENCOUNTER — Encounter: Payer: Medicare Other | Admitting: Physical Medicine & Rehabilitation

## 2017-01-17 ENCOUNTER — Encounter: Payer: Medicare Other | Attending: Physical Medicine & Rehabilitation | Admitting: Physical Medicine & Rehabilitation

## 2017-01-17 ENCOUNTER — Encounter: Payer: Self-pay | Admitting: Physical Medicine & Rehabilitation

## 2017-01-17 VITALS — BP 150/82 | HR 56

## 2017-01-17 DIAGNOSIS — H5712 Ocular pain, left eye: Secondary | ICD-10-CM | POA: Diagnosis not present

## 2017-01-17 DIAGNOSIS — Z87891 Personal history of nicotine dependence: Secondary | ICD-10-CM | POA: Diagnosis not present

## 2017-01-17 DIAGNOSIS — M961 Postlaminectomy syndrome, not elsewhere classified: Secondary | ICD-10-CM

## 2017-01-17 DIAGNOSIS — Z981 Arthrodesis status: Secondary | ICD-10-CM | POA: Insufficient documentation

## 2017-01-17 DIAGNOSIS — M1990 Unspecified osteoarthritis, unspecified site: Secondary | ICD-10-CM | POA: Diagnosis not present

## 2017-01-17 DIAGNOSIS — G43909 Migraine, unspecified, not intractable, without status migrainosus: Secondary | ICD-10-CM | POA: Diagnosis not present

## 2017-01-17 DIAGNOSIS — R269 Unspecified abnormalities of gait and mobility: Secondary | ICD-10-CM

## 2017-01-17 DIAGNOSIS — R531 Weakness: Secondary | ICD-10-CM | POA: Diagnosis not present

## 2017-01-17 DIAGNOSIS — G479 Sleep disorder, unspecified: Secondary | ICD-10-CM | POA: Diagnosis not present

## 2017-01-17 DIAGNOSIS — M545 Low back pain: Secondary | ICD-10-CM | POA: Diagnosis not present

## 2017-01-17 DIAGNOSIS — M791 Myalgia, unspecified site: Secondary | ICD-10-CM

## 2017-01-17 DIAGNOSIS — H4712 Papilledema associated with decreased ocular pressure: Secondary | ICD-10-CM | POA: Diagnosis not present

## 2017-01-17 DIAGNOSIS — G894 Chronic pain syndrome: Secondary | ICD-10-CM | POA: Diagnosis not present

## 2017-01-17 MED ORDER — GABAPENTIN 300 MG PO CAPS: 300.0000 mg | ORAL_CAPSULE | Freq: Three times a day (TID) | ORAL | 1 refills | Status: DC

## 2017-01-17 MED ORDER — AMITRIPTYLINE HCL 10 MG PO TABS: 10.0000 mg | ORAL_TABLET | Freq: Every day | ORAL | 1 refills | Status: DC

## 2017-01-17 MED ORDER — METHOCARBAMOL 500 MG PO TABS: 500.0000 mg | ORAL_TABLET | Freq: Three times a day (TID) | ORAL | 1 refills | Status: DC | PRN

## 2017-01-17 MED ORDER — DULOXETINE HCL 60 MG PO CPEP: 60.0000 mg | ORAL_CAPSULE | Freq: Every day | ORAL | 1 refills | Status: DC

## 2017-01-17 NOTE — Progress Notes (Signed)
Subjective:    Patient ID: Gina Higgins, female    DOB: 01/06/1946, 71 y.o.   MRN: [id number]  HPI 71 y/o female with pmh/psh of migraines, OA, lumbar fusion, cervical fusion, right TKR, CTS release, b/l foot surgery presents with low back pain. Started ~2013.  Denies inciting events.  Rest improves the pain.  She took her daughter-in-laws percocet that provided benefit.  Movement exacerbated the pain.  Sharp, pins/needles.  Non-radiating.  Constant.  Tylenol #3 does not help.  She has associated weakness. Denies falls in last year.  Pain prevents everything.   Pain Inventory Average Pain 10 Pain Right Now 10 My pain is sharp, stabbing and aching  In the last 24 hours, has pain interfered with the following? General activity 9 Relation with others 9 Enjoyment of life 9 What TIME of day is your pain at its worst? morning and night Sleep (in general) Fair  Pain is worse with: walking, bending, sitting, standing and some activites Pain improves with: . Relief from Meds: .  Mobility use a cane ability to climb steps?  no do you drive?  no  Function disabled: date disabled 1996 I need assistance with the following:  dressing, bathing, meal prep, household duties and shopping  Neuro/Psych tremor tingling spasms  Prior Studies Any changes since last visit?  no  Physicians involved in your care Any changes since last visit?  no   Family History  Problem Relation Age of Onset  . Hypertension Mother   . Breast cancer Mother   . Gout Father   . Hypertension Father   . Hypertension Maternal Grandmother   . Hypertension Maternal Grandfather    Social History   Social History  . Marital status: Single    Spouse name: N/A  . Number of children: 4  . Years of education: 12   Occupational History  . Retired    Social History Main Topics  . Smoking status: Former Smoker    Packs/day: 1.00    Years: 50.00    Types: Cigarettes    Quit date: 08/13/2016  .  Smokeless tobacco: Never Used  . Alcohol use 1.8 oz/week    2 Glasses of wine, 1 Cans of beer per week  . Drug use: Unknown  . Sexual activity: Not Asked   Other Topics Concern  . None   Social History Narrative   Fun: "Can't do anything because of pain." Likes to bowl    Past Surgical History:  Procedure Laterality Date  . ABDOMINAL HYSTERECTOMY     Partial  . APPENDECTOMY    . BACK SURGERY    . BREAST SURGERY     Total mastectomy  . FOOT SURGERY    . NECK SURGERY    . REPLACEMENT TOTAL KNEE Right    Past Medical History:  Diagnosis Date  . Arthritis   . Heart murmur   . History of chicken pox   . Migraines    BP (!) 150/82   Pulse (!) 56   SpO2 97%   Opioid Risk Score:   Fall Risk Score:  `1  Depression screen PHQ 2/9  [facility] 01/17/2017 09/11/2016  Decreased Interest 3 0  Down, Depressed, Hopeless 0 0  PHQ - 2 Score 3 0  Altered sleeping 3 -  Tired, decreased energy 3 -  Change in appetite 2 -  Feeling bad or failure about yourself  1 -  Trouble concentrating 2 -  Moving slowly or fidgety/restless 0 -  Suicidal thoughts 0 -  PHQ-9 Score 14 -  Difficult doing work/chores Very difficult -    Review of Systems  Constitutional: Negative.   HENT: Negative.   Eyes: Negative.   Respiratory: Positive for shortness of breath.   Cardiovascular: Negative.   Gastrointestinal: Negative.   Endocrine: Negative.   Genitourinary: Negative.   Musculoskeletal: Positive for arthralgias, back pain, gait problem, myalgias and neck pain.  Skin: Negative.   Allergic/Immunologic: Negative.   Hematological: Negative.   Psychiatric/Behavioral: Negative.   All other systems reviewed and are negative.     Objective:   Physical Exam  Gen: NAD. Vital signs reviewed HENT: Normocephalic, Atraumatic Eyes: EOMI. No discharge.  Cardio: RRR. No JVD. Pulm: B/l clear to auscultation.  Effort normal Abd: Soft, BS+ MSK:  Gait antalgic.   TTP diffusely  paraspinals, worse distally and gluteal muscles.    No edema.   Neg empty can test. Neuro:   Sensation intact to light touch in all LE dermatomes  Reflexes 2+ throughout, except right TKR site  Strength  4/5 in all LE myotomes (pain inhibition)  SLR neg b/l Skin: Warm and Dry. Intact. Healed surgical scars.    Assessment & Plan:  71 y/o female with pmh/psh of migraines, OA, lumbar fusion, cervical fusion, right TKR, CTS release, b/l foot surgery presents with low back pain  1. Failed back syndrome  Latest imaging 30 years ago, will order L-spine xrays  Labs ordered, non on file  Allergic to tramadol, Tylenol #3 ineffective  Referral information reviewed - limited  NCCSRS reviewed  UDS performed  Cont Heat  Will order Gabapentin 300 TID, states she took before, but does not recall  Will order Cymbalta 60mg  (pt states she has had previously)  Will Robaxin 500 TID PRN  Encouraged hold Tizanidine  Will order TENS IT  Will consider Mobic based on labs  Will consider referral to Psychology  Will consider referral to PT in future  Of note, pt notes she did not have insurance and took daughter-in-law's percocet which helped   2. Gait abnormality  No assistive device needed at present  3. Sleep disturbance  Will Elavil 10mg   5. Myalgia   Will consider trigger point injections in future  6. Left eye pain  Encouraged follow up with PCP

## 2017-01-21 ENCOUNTER — Ambulatory Visit (HOSPITAL_COMMUNITY)
Admission: RE | Admit: 2017-01-21 | Discharge: 2017-01-21 | Disposition: A | Discharge status: Home / Self Care | Payer: Medicare Other | Source: Ambulatory Visit | Attending: Physical Medicine & Rehabilitation | Admitting: Physical Medicine & Rehabilitation

## 2017-01-21 DIAGNOSIS — M961 Postlaminectomy syndrome, not elsewhere classified: Secondary | ICD-10-CM | POA: Diagnosis not present

## 2017-01-21 DIAGNOSIS — Z981 Arthrodesis status: Secondary | ICD-10-CM | POA: Insufficient documentation

## 2017-01-21 DIAGNOSIS — M4327 Fusion of spine, lumbosacral region: Secondary | ICD-10-CM | POA: Diagnosis not present

## 2017-01-24 LAB — CBC WITH DIFFERENTIAL/PLATELET
BASOS: 0 %
Basophils Absolute: 0 10*3/uL (ref 0.0–0.2)
EOS (ABSOLUTE): 0.1 10*3/uL (ref 0.0–0.4)
EOS: 1 %
HEMATOCRIT: 42.7 % (ref 34.0–46.6)
HEMOGLOBIN: 14.7 g/dL (ref 11.1–15.9)
IMMATURE GRANS (ABS): 0 10*3/uL (ref 0.0–0.1)
Immature Granulocytes: 0 %
LYMPHS: 43 %
Lymphocytes Absolute: 2.2 10*3/uL (ref 0.7–3.1)
MCH: 30.6 pg (ref 26.6–33.0)
MCHC: 34.4 g/dL (ref 31.5–35.7)
MCV: 89 fL (ref 79–97)
MONOCYTES: 7 %
Monocytes Absolute: 0.4 10*3/uL (ref 0.1–0.9)
NEUTROS ABS: 2.4 10*3/uL (ref 1.4–7.0)
Neutrophils: 49 %
Platelets: 219 10*3/uL (ref 150–379)
RBC: 4.8 x10E6/uL (ref 3.77–5.28)
RDW: 14 % (ref 12.3–15.4)
WBC: 5 10*3/uL (ref 3.4–10.8)

## 2017-01-24 LAB — COMPREHENSIVE METABOLIC PANEL
ALK PHOS: 74 IU/L (ref 39–117)
ALT: 11 IU/L (ref 0–32)
AST: 13 IU/L (ref 0–40)
Albumin/Globulin Ratio: 2 (ref 1.2–2.2)
Albumin: 4.7 g/dL (ref 3.5–4.8)
BILIRUBIN TOTAL: 0.4 mg/dL (ref 0.0–1.2)
BUN / CREAT RATIO: 15 (ref 12–28)
BUN: 16 mg/dL (ref 8–27)
CHLORIDE: 106 mmol/L (ref 96–106)
CO2: 23 mmol/L (ref 18–29)
CREATININE: 1.04 mg/dL — AB (ref 0.57–1.00)
Calcium: 10.8 mg/dL — ABNORMAL HIGH (ref 8.7–10.3)
GFR calc non Af Amer: 53 mL/min/{1.73_m2} — ABNORMAL LOW (ref >59)
GFR, EST AFRICAN AMERICAN: 62 mL/min/{1.73_m2} (ref >59)
GLOBULIN, TOTAL: 2.4 g/dL (ref 1.5–4.5)
Glucose: 79 mg/dL (ref 65–99)
Potassium: 4.4 mmol/L (ref 3.5–5.2)
SODIUM: 145 mmol/L — AB (ref 134–144)
TOTAL PROTEIN: 7.1 g/dL (ref 6.0–8.5)

## 2017-01-24 LAB — TOXASSURE SELECT,+ANTIDEPR,UR

## 2017-01-28 ENCOUNTER — Telehealth: Payer: Self-pay | Admitting: *Deleted

## 2017-01-28 NOTE — Telephone Encounter (Signed)
Urine drug screen for this encounter is positive for alcohol and THC.

## 2017-02-14 ENCOUNTER — Encounter: Payer: Self-pay | Admitting: Physical Medicine & Rehabilitation

## 2017-02-14 ENCOUNTER — Encounter: Payer: Medicare Other | Attending: Physical Medicine & Rehabilitation | Admitting: Physical Medicine & Rehabilitation

## 2017-02-14 VITALS — BP 121/84 | HR 74 | Resp 14

## 2017-02-14 DIAGNOSIS — G479 Sleep disorder, unspecified: Secondary | ICD-10-CM | POA: Diagnosis not present

## 2017-02-14 DIAGNOSIS — Z87891 Personal history of nicotine dependence: Secondary | ICD-10-CM | POA: Diagnosis not present

## 2017-02-14 DIAGNOSIS — M1990 Unspecified osteoarthritis, unspecified site: Secondary | ICD-10-CM | POA: Insufficient documentation

## 2017-02-14 DIAGNOSIS — Z981 Arthrodesis status: Secondary | ICD-10-CM | POA: Insufficient documentation

## 2017-02-14 DIAGNOSIS — R531 Weakness: Secondary | ICD-10-CM | POA: Insufficient documentation

## 2017-02-14 DIAGNOSIS — R269 Unspecified abnormalities of gait and mobility: Secondary | ICD-10-CM

## 2017-02-14 DIAGNOSIS — M961 Postlaminectomy syndrome, not elsewhere classified: Secondary | ICD-10-CM

## 2017-02-14 DIAGNOSIS — H4712 Papilledema associated with decreased ocular pressure: Secondary | ICD-10-CM | POA: Diagnosis not present

## 2017-02-14 DIAGNOSIS — G43909 Migraine, unspecified, not intractable, without status migrainosus: Secondary | ICD-10-CM | POA: Insufficient documentation

## 2017-02-14 DIAGNOSIS — M545 Low back pain: Secondary | ICD-10-CM | POA: Diagnosis not present

## 2017-02-14 DIAGNOSIS — M791 Myalgia, unspecified site: Secondary | ICD-10-CM

## 2017-02-14 DIAGNOSIS — G894 Chronic pain syndrome: Secondary | ICD-10-CM

## 2017-02-14 MED ORDER — GABAPENTIN 600 MG PO TABS: 600.0000 mg | ORAL_TABLET | Freq: Three times a day (TID) | ORAL | 1 refills | Status: DC

## 2017-02-14 MED ORDER — MELOXICAM 15 MG PO TABS: 15.0000 mg | ORAL_TABLET | Freq: Every day | ORAL | 1 refills | Status: DC

## 2017-02-14 MED ORDER — AMITRIPTYLINE HCL 25 MG PO TABS: 25.0000 mg | ORAL_TABLET | Freq: Every day | ORAL | 1 refills | Status: DC

## 2017-02-14 MED ORDER — METHOCARBAMOL 500 MG PO TABS: 1000.0000 mg | ORAL_TABLET | Freq: Three times a day (TID) | ORAL | 1 refills | Status: DC | PRN

## 2017-02-14 NOTE — Progress Notes (Signed)
Subjective:    Patient ID: Gina Higgins, female    DOB: 27-Jan-1946, 71 y.o.   MRN: [id number]  HPI 71 y/o female with pmh/psh of migraines, OA, lumbar fusion, cervical fusion, right TKR, CTS release, b/l foot surgery presents with low back pain.  Initially stated: Started ~2013.  Denies inciting events.  Rest improves the pain.  She took her daughter-in-laws percocet that provided benefit.  Movement exacerbated the pain.  Sharp, pins/needles.  Non-radiating.  Constant.  Tylenol #3 does not help.  She has associated weakness. Denies falls in last year.  Pain prevents everything.   Last clinic visit 01/17/17.  Since that time, pt did obtain xrays and labs, reviewed.  No benefit with Gabapentin, Cymbalta, Robaxin, Elavil.  She states she never received follow up for TENS.  She states her pain is unchanged.    Pain Inventory Average Pain 10 Pain Right Now 9 My pain is sharp, stabbing and aching  In the last 24 hours, has pain interfered with the following? General activity 10 Relation with others 8 Enjoyment of life 10 What TIME of day is your pain at its worst? morning, evening, night Sleep (in general) Poor  Pain is worse with: walking, sitting, standing and some activites Pain improves with: heat/ice and medication Relief from Meds: 4  Mobility walk without assistance how many minutes can you walk? 3 ability to climb steps?  no do you drive?  no Do you have any goals in this area?  no  Function retired I need assistance with the following:  dressing, bathing, meal prep, household duties and shopping  Neuro/Psych numbness spasms  Prior Studies Any changes since last visit?  no  Physicians involved in your care Any changes since last visit?  no   Family History  Problem Relation Age of Onset  . Hypertension Mother   . Breast cancer Mother   . Gout Father   . Hypertension Father   . Hypertension Maternal Grandmother   . Hypertension Maternal Grandfather     Social History   Social History  . Marital status: Single    Spouse name: N/A  . Number of children: 4  . Years of education: 12   Occupational History  . Retired    Social History Main Topics  . Smoking status: Former Smoker    Packs/day: 1.00    Years: 50.00    Types: Cigarettes    Quit date: 08/13/2016  . Smokeless tobacco: Never Used  . Alcohol use 1.8 oz/week    2 Glasses of wine, 1 Cans of beer per week  . Drug use: Unknown  . Sexual activity: Not Asked   Other Topics Concern  . None   Social History Narrative   Fun: "Can't do anything because of pain." Likes to bowl    Past Surgical History:  Procedure Laterality Date  . ABDOMINAL HYSTERECTOMY     Partial  . APPENDECTOMY    . BACK SURGERY    . BREAST SURGERY     Total mastectomy  . FOOT SURGERY    . NECK SURGERY    . REPLACEMENT TOTAL KNEE Right    Past Medical History:  Diagnosis Date  . Arthritis   . Heart murmur   . History of chicken pox   . Migraines    BP 121/84 (BP Location: Right Arm, Patient Position: Sitting, Cuff Size: Normal)   Pulse 74   Resp 14   SpO2 96%   Opioid Risk Score:   Fall  Risk Score:  `1  Depression screen PHQ 2/9  Depression screen [facility] 2/9 01/17/2017 09/11/2016  Decreased Interest 3 0  Down, Depressed, Hopeless 0 0  PHQ - 2 Score 3 0  Altered sleeping 3 -  Tired, decreased energy 3 -  Change in appetite 2 -  Feeling bad or failure about yourself  1 -  Trouble concentrating 2 -  Moving slowly or fidgety/restless 0 -  Suicidal thoughts 0 -  PHQ-9 Score 14 -  Difficult doing work/chores Very difficult -    Review of Systems  Constitutional: Negative.   HENT: Negative.   Eyes: Negative.   Respiratory: Positive for shortness of breath.   Cardiovascular: Negative.   Gastrointestinal: Negative.   Endocrine: Negative.   Genitourinary: Negative.   Musculoskeletal: Positive for arthralgias, back pain, gait problem, myalgias and neck pain.       Spasms    Skin: Negative.   Allergic/Immunologic: Negative.   Neurological: Positive for numbness.  Hematological: Negative.   Psychiatric/Behavioral: Negative.   All other systems reviewed and are negative.     Objective:   Physical Exam  Gen: NAD. Vital signs reviewed HENT: Normocephalic, Atraumatic Eyes: EOMI. No discharge.  Cardio: RRR. No JVD. Pulm: B/l clear to auscultation.  Effort normal Abd: Soft, BS+ MSK:  Gait antalgic.   +TTP diffusely paraspinals, worse distally and gluteal muscles.    No edema.   Neg empty can test. Neuro:   Strength  4/5 in all LE myotomes (pain inhibition) Skin: Warm and Dry. Intact. Healed surgical scars.    Assessment & Plan:  71 y/o female with pmh/psh of migraines, OA, lumbar fusion, cervical fusion, right TKR, CTS release, b/l foot surgery presents with low back pain  1. Failed back syndrome  Latest imaging 30 years ago, L-spine films showing L4-S1 fusion.    Will order MRI  Labs reviewed  Allergic to tramadol, Tylenol #3 ineffective  NCCSRS reviewed  UDS reviewed, showing Etoh, THC, and Buproprion  Cont Heat  Will increase Gabapentin 600 TID  Cont Cymbalta 60mg    Will increase Robaxin 1000 TID PRN  Will order Mobic 15 with food  Encouraged hold Tizanidine  Patient states she never obtained TENS IT  Will consider referral to Psychology  Will consider referral to PT in future  Of note, pt notes she did not have insurance and took daughter-in-law's percocet which helped  UDS inconsistent, will not prescribe narcotics. Educated pt on results and plan.   2. Gait abnormality  No assistive device needed at present  3. Sleep disturbance  Will increase Elavil to 25mg   5. Myalgia   Will consider trigger point injections in future

## 2017-03-06 ENCOUNTER — Ambulatory Visit (HOSPITAL_COMMUNITY)
Admission: RE | Admit: 2017-03-06 | Discharge: 2017-03-06 | Disposition: A | Discharge status: Home / Self Care | Payer: Medicare Other | Source: Ambulatory Visit | Attending: Physical Medicine & Rehabilitation | Admitting: Physical Medicine & Rehabilitation

## 2017-03-06 DIAGNOSIS — G894 Chronic pain syndrome: Secondary | ICD-10-CM | POA: Diagnosis not present

## 2017-03-06 DIAGNOSIS — M5126 Other intervertebral disc displacement, lumbar region: Secondary | ICD-10-CM | POA: Diagnosis not present

## 2017-03-06 DIAGNOSIS — M48061 Spinal stenosis, lumbar region without neurogenic claudication: Secondary | ICD-10-CM | POA: Diagnosis not present

## 2017-03-06 DIAGNOSIS — M961 Postlaminectomy syndrome, not elsewhere classified: Secondary | ICD-10-CM | POA: Diagnosis not present

## 2017-03-06 MED ORDER — GADOBENATE DIMEGLUMINE 529 MG/ML IV SOLN
14.0000 mL | Freq: Once | INTRAVENOUS | Status: AC | PRN
  Administered 2017-03-06: 14 mL via INTRAVENOUS

## 2017-03-14 ENCOUNTER — Encounter: Payer: Medicare Other | Attending: Physical Medicine & Rehabilitation | Admitting: Physical Medicine & Rehabilitation

## 2017-03-14 ENCOUNTER — Encounter: Payer: Self-pay | Admitting: Physical Medicine & Rehabilitation

## 2017-03-14 VITALS — BP 137/51 | HR 65 | Resp 14

## 2017-03-14 DIAGNOSIS — G43909 Migraine, unspecified, not intractable, without status migrainosus: Secondary | ICD-10-CM | POA: Insufficient documentation

## 2017-03-14 DIAGNOSIS — M791 Myalgia, unspecified site: Secondary | ICD-10-CM

## 2017-03-14 DIAGNOSIS — H4712 Papilledema associated with decreased ocular pressure: Secondary | ICD-10-CM | POA: Insufficient documentation

## 2017-03-14 DIAGNOSIS — Z87891 Personal history of nicotine dependence: Secondary | ICD-10-CM | POA: Insufficient documentation

## 2017-03-14 DIAGNOSIS — M961 Postlaminectomy syndrome, not elsewhere classified: Secondary | ICD-10-CM

## 2017-03-14 DIAGNOSIS — G479 Sleep disorder, unspecified: Secondary | ICD-10-CM | POA: Diagnosis not present

## 2017-03-14 DIAGNOSIS — M1990 Unspecified osteoarthritis, unspecified site: Secondary | ICD-10-CM | POA: Diagnosis not present

## 2017-03-14 DIAGNOSIS — Z981 Arthrodesis status: Secondary | ICD-10-CM | POA: Diagnosis not present

## 2017-03-14 DIAGNOSIS — G894 Chronic pain syndrome: Secondary | ICD-10-CM

## 2017-03-14 DIAGNOSIS — R531 Weakness: Secondary | ICD-10-CM | POA: Insufficient documentation

## 2017-03-14 DIAGNOSIS — R269 Unspecified abnormalities of gait and mobility: Secondary | ICD-10-CM

## 2017-03-14 DIAGNOSIS — M545 Low back pain: Secondary | ICD-10-CM | POA: Diagnosis not present

## 2017-03-14 MED ORDER — GABAPENTIN 600 MG PO TABS: 900.0000 mg | ORAL_TABLET | Freq: Three times a day (TID) | ORAL | 1 refills | Status: DC

## 2017-03-14 MED ORDER — GABAPENTIN 600 MG PO TABS
900.0000 mg | ORAL_TABLET | Freq: Three times a day (TID) | ORAL | 1 refills | Status: DC
Start: 2017-03-14 — End: 2021-02-13

## 2017-03-14 NOTE — Addendum Note (Signed)
Addended by: Angela NevinWESSLING, Veva Grimley D on: 03/14/2017 09:55 AM   Modules accepted: Orders

## 2017-03-14 NOTE — Progress Notes (Signed)
Subjective:    Patient ID: Gina Higgins, female    DOB: 05/14/1946, 71 y.o.   MRN: [id number]  HPI 71 y/o female with pmh/psh of migraines, OA, lumbar fusion, cervical fusion, right TKR, CTS release, b/l foot surgery presents with low back pain.  Initially stated: Started ~2013.  Denies inciting events.  Rest improves the pain.  She took her daughter-in-laws percocet that provided benefit.  Movement exacerbated the pain.  Sharp, pins/needles.  Non-radiating.  Constant.  Tylenol #3 does not help.  She has associated weakness. Denies falls in last year.  Pain prevents everything.   Last clinic visit 02/14/17.  Since that time, she had MRI done.  She continues to use heat.  She did not notice any difference with increase in Gabapentin.  She does not notice benefit with Cymbalta.  No benefit with Robaxin.  No benefit with Mobic. She now states she had a TENS unit before without benefit and has not obtained a new one.  She notices slight improvement with Elavil.    Pain Inventory Average Pain 8 Pain Right Now 8 My pain is sharp, stabbing and aching  In the last 24 hours, has pain interfered with the following? General activity 9 Relation with others 10 Enjoyment of life 10 What TIME of day is your pain at its worst? morning, evening, night Sleep (in general) Fair  Pain is worse with: walking, sitting, inactivity, standing and some activites Pain improves with: nothing Relief from Meds: 0  Mobility walk without assistance how many minutes can you walk? 3 ability to climb steps?  no do you drive?  yes Do you have any goals in this area?  yes  Function retired I need assistance with the following:  dressing, bathing, meal prep and household duties  Neuro/Psych numbness spasms  Prior Studies Any changes since last visit?  no  Physicians involved in your care Any changes since last visit?  no   Family History  Problem Relation Age of Onset  . Hypertension Mother   .  Breast cancer Mother   . Gout Father   . Hypertension Father   . Hypertension Maternal Grandmother   . Hypertension Maternal Grandfather    Social History   Social History  . Marital status: Single    Spouse name: N/A  . Number of children: 4  . Years of education: 12   Occupational History  . Retired    Social History Main Topics  . Smoking status: Former Smoker    Packs/day: 1.00    Years: 50.00    Types: Cigarettes    Quit date: 08/13/2016  . Smokeless tobacco: Never Used  . Alcohol use 1.8 oz/week    2 Glasses of wine, 1 Cans of beer per week  . Drug use: Unknown  . Sexual activity: Not Asked   Other Topics Concern  . None   Social History Narrative   Fun: "Can't do anything because of pain." Likes to bowl    Past Surgical History:  Procedure Laterality Date  . ABDOMINAL HYSTERECTOMY     Partial  . APPENDECTOMY    . BACK SURGERY    . BREAST SURGERY     Total mastectomy  . FOOT SURGERY    . NECK SURGERY    . REPLACEMENT TOTAL KNEE Right    Past Medical History:  Diagnosis Date  . Arthritis   . Heart murmur   . History of chicken pox   . Migraines    BP Marland Kitchen(!)  137/51 (BP Location: Right Arm, Patient Position: Sitting, Cuff Size: Normal)   Pulse 65   Resp 14   SpO2 93%   Opioid Risk Score:   Fall Risk Score:  `1  Depression screen PHQ 2/9  [facility] 01/17/2017 09/11/2016  Decreased Interest 3 0  Down, Depressed, Hopeless 0 0  PHQ - 2 Score 3 0  Altered sleeping 3 -  Tired, decreased energy 3 -  Change in appetite 2 -  Feeling bad or failure about yourself  1 -  Trouble concentrating 2 -  Moving slowly or fidgety/restless 0 -  Suicidal thoughts 0 -  PHQ-9 Score 14 -  Difficult doing work/chores Very difficult -    Review of Systems  Constitutional: Negative.   HENT: Negative.   Eyes: Negative.   Respiratory: Positive for shortness of breath.   Cardiovascular: Negative.   Gastrointestinal: Negative.   Endocrine: Negative.    Genitourinary: Negative.   Musculoskeletal: Positive for arthralgias, back pain, gait problem, myalgias and neck pain.       Spasms   Skin: Negative.   Allergic/Immunologic: Negative.   Neurological: Positive for numbness.  Hematological: Negative.   Psychiatric/Behavioral: Negative.   All other systems reviewed and are negative.     Objective:   Physical Exam  Gen: NAD. Vital signs reviewed HENT: Normocephalic, Atraumatic Eyes: EOMI. No discharge.  Cardio: RRR. No JVD. Pulm: B/l clear to auscultation.  Effort normal Abd: Soft, BS+ MSK:  Gait antalgic.   +TTP diffusely paraspinals, worse distally and gluteal muscles.    No edema.  Neuro:   Strength  4/5 in all LE myotomes (pain inhibition) Skin: Warm and Dry. Intact. Healed surgical scars.    Assessment & Plan:  71 y/o female with pmh/psh of migraines, OA, lumbar fusion, cervical fusion, right TKR, CTS release, b/l foot surgery presents with low back pain  1. Failed back syndrome  Latest imaging 30 years ago, L-spine films showing L4-S1 fusion.    MRI reviewed, most significant for stenosis at L  Labs reviewed L2-3  Allergic to tramadol, Tylenol #3 ineffective  Cont Heat  Will increase Gabapentin 900 TID  Cont Cymbalta 60mg    Cont Robaxin 1000 TID PRN  Cont Mobic 15 with food  Encouraged hold Tizanidine  Patient states she never obtained TENS IT, now longer believes it is necessary because it will be ineffective  Will consider referral to Psychology  Will consider referral to PT in future after injections  UDS inconsistent, with Etoh, THC, and Buproprion, will not prescribe narcotics.   Will schedule for ESIs  2. Gait abnormality  No assistive device needed at present  3. Sleep disturbance  Cont Elavil to 25mg   5. Myalgia   Will consider trigger point injections in future

## 2017-03-25 ENCOUNTER — Ambulatory Visit: Payer: Medicare Other | Admitting: Physical Medicine & Rehabilitation

## 2017-03-26 ENCOUNTER — Encounter: Payer: Self-pay | Admitting: Physical Medicine & Rehabilitation

## 2017-03-26 ENCOUNTER — Encounter: Payer: Medicare Other | Attending: Physical Medicine & Rehabilitation

## 2017-03-26 ENCOUNTER — Ambulatory Visit (HOSPITAL_BASED_OUTPATIENT_CLINIC_OR_DEPARTMENT_OTHER): Payer: Medicare Other | Admitting: Physical Medicine & Rehabilitation

## 2017-03-26 VITALS — BP 126/74 | HR 61

## 2017-03-26 DIAGNOSIS — M545 Low back pain: Secondary | ICD-10-CM | POA: Diagnosis not present

## 2017-03-26 DIAGNOSIS — M791 Myalgia: Secondary | ICD-10-CM | POA: Diagnosis not present

## 2017-03-26 DIAGNOSIS — Z981 Arthrodesis status: Secondary | ICD-10-CM | POA: Diagnosis not present

## 2017-03-26 DIAGNOSIS — G43909 Migraine, unspecified, not intractable, without status migrainosus: Secondary | ICD-10-CM | POA: Insufficient documentation

## 2017-03-26 DIAGNOSIS — R531 Weakness: Secondary | ICD-10-CM | POA: Diagnosis not present

## 2017-03-26 DIAGNOSIS — H4712 Papilledema associated with decreased ocular pressure: Secondary | ICD-10-CM | POA: Insufficient documentation

## 2017-03-26 DIAGNOSIS — M1990 Unspecified osteoarthritis, unspecified site: Secondary | ICD-10-CM | POA: Diagnosis not present

## 2017-03-26 DIAGNOSIS — G479 Sleep disorder, unspecified: Secondary | ICD-10-CM | POA: Diagnosis not present

## 2017-03-26 DIAGNOSIS — Z87891 Personal history of nicotine dependence: Secondary | ICD-10-CM | POA: Diagnosis not present

## 2017-03-26 DIAGNOSIS — M5416 Radiculopathy, lumbar region: Secondary | ICD-10-CM

## 2017-03-26 NOTE — Progress Notes (Signed)
Right L1-2 Lumbar transforaminal epidural steroid injection under fluoroscopic guidance  Indication: Lumbosacral radiculitis is not relieved by medication management or other conservative care and interfering with self-care and mobility.   Informed consent was obtained after describing risk and benefits of the procedure with the patient, this includes bleeding, bruising, infection, paralysis and medication side effects.  The patient wishes to proceed and has given written consent.  Patient was placed in prone position.  The lumbar area was marked and prepped with Betadine.  It was entered with a 25-gauge 1-1/2 inch needle and one mL of 1% lidocaine was injected into the skin and subcutaneous tissue.  Then a 22-gauge 3.5inch spinal needle was inserted into the Right L1-2 intervertebral foramen under AP, lateral, and oblique view.  Then a solution containing one mL of 10 mg per mL dexamethasone and 2 mL of 1% lidocaine was injected.  The patient tolerated procedure well.  Post procedure instructions were given.  Please see post procedure form.

## 2017-03-26 NOTE — Progress Notes (Signed)
  PROCEDURE RECORD [facility]   Name: Gina Higgins DOB:[birth year] MRN: [id number]  Date:03/26/2017  Physician: Claudette LawsAndrew Kirsteins, MD    Nurse/CMA: Orrie Lascano CMA  Allergies:  Allergies  Allergen Reactions  . Compazine [Prochlorperazine Edisylate]     Lock jaw  . Pineapple Other (See Comments)    Lockjaw   . Tramadol Itching    Consent Signed: Yes.    Is patient diabetic? No.  CBG today? na  Pregnant: No. LMP: No LMP recorded. (age [age])  Anticoagulants: no Anti-inflammatory: no Antibiotics: no  Procedure: Transforminal L1-2 ESI Position: Prone   Start Time: 1250pm End Time: 1255pm Fluoro Time: 29s  RN/CMA Ersel Wadleigh CMA Jamyla Ard CMA    Time 1232 1258pm    BP 126/74 121/76    Pulse 61 56    Respirations 16 16    O2 Sat 99 99    S/S 99 6    Pain Level 8/10 0/10     D/C home with Clearnce SorrelSister Margie, patient A & O X 3, D/C instructions reviewed, and sits independently.

## 2017-04-24 ENCOUNTER — Encounter: Payer: Medicare Other | Admitting: Physical Medicine & Rehabilitation

## 2017-05-07 ENCOUNTER — Ambulatory Visit: Payer: Medicare Other | Admitting: Physical Medicine & Rehabilitation

## 2017-05-08 ENCOUNTER — Encounter: Payer: Medicare Other | Attending: Physical Medicine & Rehabilitation | Admitting: Physical Medicine & Rehabilitation

## 2017-05-08 ENCOUNTER — Encounter: Payer: Self-pay | Admitting: Physical Medicine & Rehabilitation

## 2017-05-08 VITALS — BP 147/76 | HR 68 | Resp 14

## 2017-05-08 DIAGNOSIS — H4712 Papilledema associated with decreased ocular pressure: Secondary | ICD-10-CM | POA: Insufficient documentation

## 2017-05-08 DIAGNOSIS — M961 Postlaminectomy syndrome, not elsewhere classified: Secondary | ICD-10-CM | POA: Diagnosis not present

## 2017-05-08 DIAGNOSIS — Z981 Arthrodesis status: Secondary | ICD-10-CM | POA: Diagnosis not present

## 2017-05-08 DIAGNOSIS — G894 Chronic pain syndrome: Secondary | ICD-10-CM

## 2017-05-08 DIAGNOSIS — M791 Myalgia, unspecified site: Secondary | ICD-10-CM

## 2017-05-08 DIAGNOSIS — R531 Weakness: Secondary | ICD-10-CM | POA: Insufficient documentation

## 2017-05-08 DIAGNOSIS — M5416 Radiculopathy, lumbar region: Secondary | ICD-10-CM | POA: Diagnosis not present

## 2017-05-08 DIAGNOSIS — Z87891 Personal history of nicotine dependence: Secondary | ICD-10-CM | POA: Insufficient documentation

## 2017-05-08 DIAGNOSIS — R269 Unspecified abnormalities of gait and mobility: Secondary | ICD-10-CM

## 2017-05-08 DIAGNOSIS — M545 Low back pain: Secondary | ICD-10-CM | POA: Insufficient documentation

## 2017-05-08 DIAGNOSIS — G479 Sleep disorder, unspecified: Secondary | ICD-10-CM | POA: Insufficient documentation

## 2017-05-08 DIAGNOSIS — M1990 Unspecified osteoarthritis, unspecified site: Secondary | ICD-10-CM | POA: Insufficient documentation

## 2017-05-08 DIAGNOSIS — G43909 Migraine, unspecified, not intractable, without status migrainosus: Secondary | ICD-10-CM | POA: Diagnosis not present

## 2017-05-08 MED ORDER — AMITRIPTYLINE HCL 75 MG PO TABS: 75.0000 mg | ORAL_TABLET | Freq: Every day | ORAL | 1 refills | Status: DC

## 2017-05-08 NOTE — Progress Notes (Signed)
Subjective:    Patient ID: Gina Higgins, female    DOB: 1946/07/15, 71 y.o.   MRN: [id number]  HPI 70 y/o female with pmh/psh of migraines, OA, lumbar fusion, cervical fusion, right TKR, CTS release, b/l foot surgery presents with low back pain.  Initially stated: Started ~2013.  Denies inciting events.  Rest improves the pain.  She took her daughter-in-laws percocet that provided benefit.  Movement exacerbated the pain.  Sharp, pins/needles.  Non-radiating.  Constant.  Tylenol #3 does not help.  She has associated weakness. Denies falls in last year.  Pain prevents everything.   Last clinic visit 03/14/17.  Since last visit, she notes no difference with increase in Gabapentin.  No benefit with Mobic.  She only received benefit for 3-4 days from transforaminal injections.  No benefit with Elavil.  Pain Inventory Average Pain 9 Pain Right Now 9 My pain is constant, sharp, stabbing, tingling and aching  In the last 24 hours, has pain interfered with the following? General activity 10 Relation with others 9 Enjoyment of life 10 What TIME of day is your pain at its worst? all, evening, night Sleep (in general) Poor  Pain is worse with: walking, bending, sitting, inactivity, standing and some activites Pain improves with: nothing Relief from Meds: 0  Mobility walk without assistance walk with assistance use a walker ability to climb steps?  no do you drive?  no Do you have any goals in this area?  yes  Function disabled: date disabled . I need assistance with the following:  dressing, bathing, meal prep, household duties and shopping  Neuro/Psych numbness tingling spasms  Prior Studies Any changes since last visit?  no  Physicians involved in your care Any changes since last visit?  no   Family History  Problem Relation Age of Onset  . Hypertension Mother   . Breast cancer Mother   . Gout Father   . Hypertension Father   . Hypertension Maternal Grandmother   .  Hypertension Maternal Grandfather    Social History   Social History  . Marital status: Single    Spouse name: N/A  . Number of children: 4  . Years of education: 12   Occupational History  . Retired    Social History Main Topics  . Smoking status: Former Smoker    Packs/day: 1.00    Years: 50.00    Types: Cigarettes    Quit date: 08/13/2016  . Smokeless tobacco: Never Used  . Alcohol use 1.8 oz/week    2 Glasses of wine, 1 Cans of beer per week  . Drug use: Unknown  . Sexual activity: Not Asked   Other Topics Concern  . None   Social History Narrative   Fun: "Can't do anything because of pain." Likes to bowl    Past Surgical History:  Procedure Laterality Date  . ABDOMINAL HYSTERECTOMY     Partial  . APPENDECTOMY    . BACK SURGERY    . BREAST SURGERY     Total mastectomy  . FOOT SURGERY    . NECK SURGERY    . REPLACEMENT TOTAL KNEE Right    Past Medical History:  Diagnosis Date  . Arthritis   . Heart murmur   . History of chicken pox   . Migraines    BP (!) 147/76 (BP Location: Right Arm, Patient Position: Sitting, Cuff Size: Normal)   Pulse 68   Resp 14   SpO2 94%   Opioid Risk Score:  Fall Risk Score:  `1  Depression screen PHQ 2/9  Depression screen [facility] 2/9 01/17/2017 09/11/2016  Decreased Interest 3 0  Down, Depressed, Hopeless 0 0  PHQ - 2 Score 3 0  Altered sleeping 3 -  Tired, decreased energy 3 -  Change in appetite 2 -  Feeling bad or failure about yourself  1 -  Trouble concentrating 2 -  Moving slowly or fidgety/restless 0 -  Suicidal thoughts 0 -  PHQ-9 Score 14 -  Difficult doing work/chores Very difficult -    Review of Systems  Constitutional: Negative.   HENT: Negative.   Eyes: Negative.   Respiratory: Positive for shortness of breath.   Cardiovascular: Negative.   Gastrointestinal: Negative.   Endocrine: Negative.   Genitourinary: Negative.   Musculoskeletal: Positive for arthralgias, back pain, gait problem,  myalgias and neck pain.       Spasms   Skin: Negative.   Allergic/Immunologic: Negative.   Neurological: Positive for numbness.       Tingling  Hematological: Negative.   Psychiatric/Behavioral: Negative.   All other systems reviewed and are negative.     Objective:   Physical Exam  Gen: NAD. Vital signs reviewed HENT: Normocephalic, Atraumatic Eyes: EOMI. No discharge.  Cardio: RRR. No JVD. Pulm: B/l clear to auscultation.  Effort normal Abd: Soft, BS+ MSK:  Gait antalgic.   +TTP diffusely paraspinals  No edema.  Neuro:   Strength  4/5 in all LE myotomes (pain inhibition) Skin: Warm and Dry. Intact. Healed surgical scars.    Assessment & Plan:  71 y/o female with pmh/psh of migraines, OA, lumbar fusion, cervical fusion, right TKR, CTS release, b/l foot surgery presents with low back pain  1. Failed back syndrome  Latest imaging 30 years ago, L-spine films showing L4-S1 fusion.    MRI reviewed, most significant for stenosis at L2-3  Allergic to tramadol, Tylenol #3, Gabapentin, Lyrica, Robaxin, Mobic, L1-2 transformational injection ineffective  Cont Heat  Will wean Gabapentin  Will wean Cymbalta   May resume Tizanidine  Patient states she never obtained TENS IT, now longer believes it is necessary because it will be ineffective  Will refer to Psychology  Will refer to PT for aquatic therapy   UDS inconsistent, with Etoh, THC, and Buproprion, will not prescribe narcotics - pt upset about no plans for narcotics  Will reschedule for ESIs  2. Gait abnormality  No assistive device needed at present  3. Sleep disturbance  Will increase Elavil to  qhs  5. Myalgia   Will consider trigger point injections in future

## 2017-05-27 DIAGNOSIS — R262 Difficulty in walking, not elsewhere classified: Secondary | ICD-10-CM | POA: Diagnosis not present

## 2017-05-27 DIAGNOSIS — M542 Cervicalgia: Secondary | ICD-10-CM | POA: Diagnosis not present

## 2017-05-27 DIAGNOSIS — M545 Low back pain: Secondary | ICD-10-CM | POA: Diagnosis not present

## 2017-05-27 DIAGNOSIS — M6281 Muscle weakness (generalized): Secondary | ICD-10-CM | POA: Diagnosis not present

## 2017-05-29 DIAGNOSIS — M6281 Muscle weakness (generalized): Secondary | ICD-10-CM | POA: Diagnosis not present

## 2017-05-29 DIAGNOSIS — M542 Cervicalgia: Secondary | ICD-10-CM | POA: Diagnosis not present

## 2017-05-29 DIAGNOSIS — M545 Low back pain: Secondary | ICD-10-CM | POA: Diagnosis not present

## 2017-05-29 DIAGNOSIS — R262 Difficulty in walking, not elsewhere classified: Secondary | ICD-10-CM | POA: Diagnosis not present

## 2017-06-07 ENCOUNTER — Ambulatory Visit: Payer: Medicare Other | Admitting: Physical Medicine & Rehabilitation

## 2017-06-07 ENCOUNTER — Ambulatory Visit: Payer: Medicare Other

## 2017-06-11 ENCOUNTER — Emergency Department (HOSPITAL_COMMUNITY)
Admission: EM | Admit: 2017-06-11 | Discharge: 2017-06-11 | Disposition: A | Discharge status: Home / Self Care | Payer: Medicare Other | Attending: Emergency Medicine | Admitting: Emergency Medicine

## 2017-06-11 ENCOUNTER — Encounter (HOSPITAL_COMMUNITY): Payer: Self-pay | Admitting: Emergency Medicine

## 2017-06-11 ENCOUNTER — Ambulatory Visit: Payer: Medicare Other | Admitting: Physical Medicine & Rehabilitation

## 2017-06-11 DIAGNOSIS — Z79899 Other long term (current) drug therapy: Secondary | ICD-10-CM | POA: Diagnosis not present

## 2017-06-11 DIAGNOSIS — Z96651 Presence of right artificial knee joint: Secondary | ICD-10-CM | POA: Insufficient documentation

## 2017-06-11 DIAGNOSIS — R21 Rash and other nonspecific skin eruption: Secondary | ICD-10-CM | POA: Diagnosis not present

## 2017-06-11 DIAGNOSIS — Z87891 Personal history of nicotine dependence: Secondary | ICD-10-CM | POA: Insufficient documentation

## 2017-06-11 MED ORDER — HYDROCORTISONE 1 % EX CREA: TOPICAL_CREAM | CUTANEOUS | 0 refills | Status: DC

## 2017-06-11 NOTE — Discharge Instructions (Signed)
The bumps on your skin which are itchy and painful appear to be bug bites. Please fill prescription for topical hydrocortisone cream and apply this medicine twice a day for itching.  Please schedule an appointment with your primary doctor if your symptoms are not improved in the next week.  Please return to the emergency department if you develop fever with rash, worsening redness/swelling or any new or concerning symptoms.

## 2017-06-11 NOTE — ED Triage Notes (Signed)
Patient c/o red bumps/rash on arms and legs for couple days that is painful. Denies any new soaps, loations, detergents etc

## 2017-06-11 NOTE — ED Provider Notes (Signed)
[facility] Note   CSN: [id number] Arrival date & time: 06/11/17  1352     History   Chief Complaint Chief Complaint  Patient presents with  . Rash    HPI Gina Higgins is a 71 y.o. female.  HPI   Ms. Gina Higgins is a 71 -year-old female with a history of chronic low back pain, migraine, arthritis who presents the emergency department for evaluation of red bumps on her arms and legs. Patient states that she noticed the bumps about 2 days ago. They are itchy and painful. States that they are located primarily on the right upper arm, right wrist, right lower leg. She reports surrounding redness in the areas where she has been itching the bumps. Pain is 7/10 in severity, "stinging" in nature and constant. She has placed hot towels over the bumps with improvement of pain. Denies fever, wound, arthralgias, headaches, abdominal pain, nausea/vomiting. Denies history of similar rash. Denies recent outdoor travel. Denies new lotions, soaps, detergents.   Past Medical History:  Diagnosis Date  . Arthritis   . Heart murmur   . History of chicken pox   . Migraines     Patient Active Problem List   Diagnosis Date Noted  . Chronic neck pain 09/11/2016  . Low back pain 09/11/2016  . Migraine 09/11/2016    Past Surgical History:  Procedure Laterality Date  . ABDOMINAL HYSTERECTOMY     Partial  . APPENDECTOMY    . BACK SURGERY    . BREAST SURGERY     Total mastectomy  . FOOT SURGERY    . NECK SURGERY    . REPLACEMENT TOTAL KNEE Right     OB History    No data available       Home Medications    Prior to Admission medications   Medication Sig Start Date End Date Taking? Authorizing Provider  amitriptyline (ELAVIL) 75 MG tablet Take 1 tablet (75 mg total) by mouth at bedtime. 05/08/17   Marcello FennelPatel, Ankit Anil, MD  diclofenac sodium (VOLTAREN) 1 % GEL Apply 2 g topically 4 (four) times daily. 09/11/16   Veryl Speakalone, Gregory D, FNP  DULoxetine  (CYMBALTA) 60 MG capsule Take 1 capsule (60 mg total) by mouth daily. 01/17/17   Marcello FennelPatel, Ankit Anil, MD  gabapentin (NEURONTIN) 600 MG tablet Take 1.5 tablets (900 mg total) by mouth 3 (three) times daily. 03/14/17 04/13/17  Marcello FennelPatel, Ankit Anil, MD  hydrocortisone cream 1 % Apply to affected area 2 times daily 06/11/17   Kellie ShropshireShrosbree, Delvonte Berenson J, PA-C  meloxicam (MOBIC) 15 MG tablet Take 1 tablet (15 mg total) by mouth daily. 02/14/17   Marcello FennelPatel, Ankit Anil, MD  methocarbamol (ROBAXIN) 500 MG tablet Take 2 tablets (1,000 mg total) by mouth every 8 (eight) hours as needed for muscle spasms. 02/14/17   Marcello FennelPatel, Ankit Anil, MD  tiZANidine (ZANAFLEX) 4 MG tablet Take 1-2 tablets (4-8 mg total) by mouth every 8 (eight) hours as needed for muscle spasms. 11/09/16   Veryl Speakalone, Gregory D, FNP    Family History Family History  Problem Relation Age of Onset  . Hypertension Mother   . Breast cancer Mother   . Gout Father   . Hypertension Father   . Hypertension Maternal Grandmother   . Hypertension Maternal Grandfather     Social History Social History  Substance Use Topics  . Smoking status: Former Smoker    Packs/day: 1.00    Years: 50.00    Types: Cigarettes  Quit date: 08/13/2016  . Smokeless tobacco: Never Used  . Alcohol use 1.8 oz/week    2 Glasses of wine, 1 Cans of beer per week     Allergies   Compazine [prochlorperazine edisylate]; Pineapple; and Tramadol   Review of Systems Review of Systems  Constitutional: Negative for chills, fatigue and fever.  Gastrointestinal: Negative for abdominal pain, nausea and vomiting.  Musculoskeletal: Negative for arthralgias, gait problem and joint swelling.  Skin: Positive for color change and rash. Negative for wound.  Neurological: Negative for weakness, numbness and headaches.     Physical Exam Updated Vital Signs BP 115/75 (BP Location: Left Arm)   Pulse 84   Temp 98.4 F (36.9 C) (Oral)   Resp 18   SpO2 95%   Physical Exam  Constitutional:  She is oriented to person, place, and time. She appears well-developed and well-nourished. No distress.  HENT:  Head: Normocephalic and atraumatic.  Eyes: Right eye exhibits no discharge. Left eye exhibits no discharge.  Pulmonary/Chest: Effort normal. No respiratory distress.  Musculoskeletal: Normal range of motion.  Neurological: She is alert and oriented to person, place, and time. Coordination normal.  Skin: Skin is warm and dry. Capillary refill takes less than 2 seconds. She is not diaphoretic.  Patient with several erythematous 2-42mm papules located on right wrist, right upper arm, right and left leg. Mild surrounding erythema on the left arm where patient has been scratching. No vesicles, ecchymoses, petechiae. See picture below.  Psychiatric: She has a normal mood and affect. Her behavior is normal.  Nursing note and vitals reviewed.          ED Treatments / Results  Labs (all labs ordered are listed, but only abnormal results are displayed) Labs Reviewed - No data to display  EKG  EKG Interpretation None       Radiology No results found.  Procedures Procedures (including critical care time)  Medications Ordered in ED Medications - No data to display   Initial Impression / Assessment and Plan / ED Course  I have reviewed the triage vital signs and the nursing notes.  Pertinent labs & imaging results that were available during my care of the patient were reviewed by me and considered in my medical decision making (see chart for details).    Patient presents with itchy, painful red bumps on right wrist, arm and right leg. These appear to be bug bites. Mild erythema on upper right arm where patient has been itching. No fever, warmth or drainage to suggest infection. Will treat with topical hydrocortisone cream. Counseled patient on return precautions and she agrees and voices understanding. Discussed PCP follow up if symptoms are not improved in a week. Patient  no acute distress, vital signs stable. Discussed this patient with Dr. Clarene Duke who agrees with plan to discharge.   Final Clinical Impressions(s) / ED Diagnoses   Final diagnoses:  Rash    New Prescriptions New Prescriptions   HYDROCORTISONE CREAM 1 %    Apply to affected area 2 times daily     Lawrence Marseilles 06/11/17 1558    Little, Ambrose Finland, MD 06/12/17 [phone]

## 2017-07-23 ENCOUNTER — Encounter: Payer: Medicare Other | Attending: Physical Medicine & Rehabilitation | Admitting: Psychology

## 2017-07-23 DIAGNOSIS — G894 Chronic pain syndrome: Secondary | ICD-10-CM | POA: Insufficient documentation

## 2017-07-23 DIAGNOSIS — G479 Sleep disorder, unspecified: Secondary | ICD-10-CM | POA: Insufficient documentation

## 2017-07-23 DIAGNOSIS — Z803 Family history of malignant neoplasm of breast: Secondary | ICD-10-CM | POA: Insufficient documentation

## 2017-07-23 DIAGNOSIS — M961 Postlaminectomy syndrome, not elsewhere classified: Secondary | ICD-10-CM

## 2017-07-23 DIAGNOSIS — G43909 Migraine, unspecified, not intractable, without status migrainosus: Secondary | ICD-10-CM | POA: Diagnosis not present

## 2017-07-23 DIAGNOSIS — Z8249 Family history of ischemic heart disease and other diseases of the circulatory system: Secondary | ICD-10-CM | POA: Insufficient documentation

## 2017-07-23 DIAGNOSIS — M5416 Radiculopathy, lumbar region: Secondary | ICD-10-CM | POA: Insufficient documentation

## 2017-07-23 DIAGNOSIS — M1990 Unspecified osteoarthritis, unspecified site: Secondary | ICD-10-CM | POA: Insufficient documentation

## 2017-07-25 ENCOUNTER — Encounter: Payer: Self-pay | Admitting: Psychology

## 2017-07-25 NOTE — Progress Notes (Signed)
Neuropsychological Consultation   Patient:   Gina Higgins   DOB:   May 01, 1946  MR Number:  [id number]  Location:  [facility] [facility]YSICAL MEDICINE AND REHABILITATION [address], [place] [address] [id number] [place] [id number] Dept: [phone]           Date of Service:   07/23/2017  Start Time:   9 AM End Time:   10 AM  Provider/Observer:  Arley PhenixJohn Rodenbough, Psy.D.       Clinical Neuropsychologist       Billing Code/Service: [phone]) [phone] 4 Units  Chief Complaint:    Gina BunkerLorraine Aboud is a 71 year old female referred by Dr. Allena KatzPatel for psychological/neuropsychological interventions.  The patient has a history of migraine headaches, multiple back surgeries including lumbar fusion and cervical fusion and the development and worsening of severe pain symptoms over the past year.  The patient is having a lot of stress and coping difficulties and sleep disturbance related to the severe pain.  The patient reports that she simply cannot function out due to pain.  Reason for Service:  Gina BunkerLorraine Dabbs was referred by Dr. Allena KatzPatel for psychological/neuropsychological interventions.  The patient reports severe significant pain and the fact that various medications do not seem to really help with her pain.  She reports that this is keeping her from functioning.  The patient is tried a number of interventions but insists that the only medicine that really seems to benefit her is Percocet.  The patient reports that when she is without any medications that she has tried marijuana on at least one occasion and prior urinalysis drug screen was consistent with alcohol, THC and bupropion.  Current Status:  The patient describes significant issues with chronic pain and difficulty managing this pain.  She has had multiple back surgeries but her pain began to become worsening and extremely problematic around 1 year ago.  Reliability of Information: Information is  derived from a face-to-face clinical interview with the patient as well as review of available medical records.  Behavioral Observation: Gina BunkerLorraine Randol  presents as a 71 y.o.-year-old Right African American Female who appeared her stated age. her dress was Appropriate and she was Well Groomed and her manners were Appropriate to the situation.  her participation was indicative of Appropriate and Attentive behaviors.  There were any physical disabilities noted.  she displayed an appropriate level of cooperation and motivation.     Interactions:    Active Appropriate, Redirectable and While there is acknowledgment of significant pain the patient tended to focus on her need for narcotic pain medicine as nothing else really seem to help.  Attention:   within normal limits and attention span and concentration were age appropriate  Memory:   within normal limits; recent and remote memory intact  Visuo-spatial:  not examined  Speech (Volume):  normal  Speech:   normal; normal  Thought Process:  Coherent and Relevant  Though Content:  WNL; not suicidal  Orientation:   person, place, time/date and situation  Judgment:   Fair  Planning:   Fair  Affect:    Irritable  Mood:    Dysphoric  Insight:   Fair  Intelligence:   normal  Marital Status/Living: The patient was born [place]  She is single.  She has 4 children age 73-50.  Current Employment: The patient is not working at the current time and is retired.  Past Employment:  The patient reports that she worked throughout  her life.  Substance Use:  There is a documented history of prescription drug abuse confirmed by the patient.the patient does not appear to have had any type of specific abuse but has used prescription pain medicines when they were specifically prescribed to her.  Education:   HS Graduate  Medical History:   Past Medical History:  Diagnosis Date  . Arthritis   . Heart murmur   . History of  chicken pox   . Migraines            Psychiatric History:  Patient denies any prior psychiatric history.  Family Med/Psych History:  Family History  Problem Relation Age of Onset  . Hypertension Mother   . Breast cancer Mother   . Gout Father   . Hypertension Father   . Hypertension Maternal Grandmother   . Hypertension Maternal Grandfather     Risk of Suicide/Violence: low the patient denies any suicidal or homicidal ideation.  Impression/DX:  Gina BunkerLorraine Pink is a 71 year old female referred by Dr. Allena KatzPatel for psychological/neuropsychological interventions.  The patient has a history of migraine headaches, multiple back surgeries including lumbar fusion and cervical fusion and the development and worsening of severe pain symptoms over the past year.  The patient is having a lot of stress and coping difficulties and sleep disturbance related to the severe pain.  The patient reports that she simply cannot function out due to pain.  Disposition/Plan:  We will set the patient up for individual psychotherapeutic and neuropsychological interventions to facilitate with treating and coping chronic pain deficits in conjunction with her ongoing physical medicine care.  Diagnosis:    Right lumbar radiculitis  Failed back syndrome  Sleep disturbance  Chronic pain syndrome         Electronically Signed   _______________________ Arley PhenixJohn Rodenbough, Psy.D.

## 2017-09-10 ENCOUNTER — Encounter: Payer: Medicare Other | Attending: Physical Medicine & Rehabilitation | Admitting: Psychology

## 2017-09-10 ENCOUNTER — Encounter: Payer: Self-pay | Admitting: Psychology

## 2017-09-10 DIAGNOSIS — M961 Postlaminectomy syndrome, not elsewhere classified: Secondary | ICD-10-CM

## 2017-09-10 DIAGNOSIS — G479 Sleep disorder, unspecified: Secondary | ICD-10-CM | POA: Insufficient documentation

## 2017-09-10 DIAGNOSIS — M1990 Unspecified osteoarthritis, unspecified site: Secondary | ICD-10-CM | POA: Insufficient documentation

## 2017-09-10 DIAGNOSIS — Z803 Family history of malignant neoplasm of breast: Secondary | ICD-10-CM | POA: Insufficient documentation

## 2017-09-10 DIAGNOSIS — G43909 Migraine, unspecified, not intractable, without status migrainosus: Secondary | ICD-10-CM | POA: Insufficient documentation

## 2017-09-10 DIAGNOSIS — G894 Chronic pain syndrome: Secondary | ICD-10-CM

## 2017-09-10 DIAGNOSIS — M5416 Radiculopathy, lumbar region: Secondary | ICD-10-CM | POA: Diagnosis not present

## 2017-09-10 DIAGNOSIS — Z8249 Family history of ischemic heart disease and other diseases of the circulatory system: Secondary | ICD-10-CM | POA: Insufficient documentation

## 2017-09-10 NOTE — Progress Notes (Signed)
Neuropsychological Consultation   Patient:   Gina Higgins   DOB:   10-30-1945  MR Number:  [id number]  Location:  [facility] FOR [facility]IN AND [facility] MEDICINE [facility] PHYSICAL MEDICINE AND REHABILITATION [address] [place] [address] [id number] [place] [address] Dept: [phone]           Date of Service:   07/23/2017  Start Time:   9 AM End Time:   10 AM  Provider/Observer:  Arley Phenix, Psy.D.       Clinical Neuropsychologist       Billing Code/Service: 62952 4 Units  Chief Complaint:    Gina Higgins is a 72 year old female referred by Dr. Allena Katz for psychological/neuropsychological interventions.  The patient has a history of migraine headaches, multiple back surgeries including lumbar fusion and cervical fusion and the development and worsening of severe pain symptoms over the past year.  The patient is having a lot of stress and coping difficulties and sleep disturbance related to the severe pain.  The patient reports that she simply cannot function out due to pain.  Reason for Service:  Gina Higgins was referred by Dr. Allena Katz for psychological/neuropsychological interventions.  The patient reports severe significant pain and the fact that various medications do not seem to really help with her pain.  She reports that this is keeping her from functioning.  The patient is tried a number of interventions but insists that the only medicine that really seems to benefit her is Percocet.  The patient reports that when she is without any medications that she has tried marijuana on at least one occasion and prior urinalysis drug screen was consistent with alcohol, THC and bupropion.  Current Status:  The patient reports that she is continuing to have significant pain.  She is not taking any pain medications at this time and has not done so for some time.  The patient reports that her pain is steady and constant.  The patient reports that she is staying away  from any substance use.  Reliability of Information: Information is derived from a face-to-face clinical interview with the patient as well as review of available medical records.  Behavioral Observation: Gina Higgins  presents as a 72 y.o.-year-old Right African American Female who appeared her stated age. her dress was Appropriate and she was Well Groomed and her manners were Appropriate to the situation.  her participation was indicative of Appropriate and Attentive behaviors.  There were any physical disabilities noted.  she displayed an appropriate level of cooperation and motivation.     Interactions:    Active Appropriate, Redirectable and While there is acknowledgment of significant pain the patient tended to focus on her need for narcotic pain medicine as nothing else really seem to help.  Attention:   within normal limits and attention span and concentration were age appropriate  Memory:   within normal limits; recent and remote memory intact  Visuo-spatial:  not examined  Speech (Volume):  normal  Speech:   normal; normal  Thought Process:  Coherent and Relevant  Though Content:  WNL; not suicidal  Orientation:   person, place, time/date and situation  Judgment:   Fair  Planning:   Fair  Affect:    Irritable  Mood:    Dysphoric  Insight:   Fair  Intelligence:   normal  Marital Status/Living: The patient was born and raised in [place].  She is single.  She has 4 children age 12-50.  Current Employment: The patient is  not working at the current time and is retired.  Past Employment:  The patient reports that she worked throughout her life.  Substance Use:  There is a documented history of prescription drug abuse confirmed by the patient.the patient does not appear to have had any type of specific abuse but has used prescription pain medicines when they were specifically prescribed to her.  Education:   HS Graduate  Medical History:   Past Medical History:   Diagnosis Date  . Arthritis   . Heart murmur   . History of chicken pox   . Migraines            Psychiatric History:  Patient denies any prior psychiatric history.  Family Med/Psych History:  Family History  Problem Relation Age of Onset  . Hypertension Mother   . Breast cancer Mother   . Gout Father   . Hypertension Father   . Hypertension Maternal Grandmother   . Hypertension Maternal Grandfather     Risk of Suicide/Violence: low the patient denies any suicidal or homicidal ideation.  Impression/DX:  Gina Higgins is a 72 year old female referred by Dr. Allena KatzPatel for psychological/neuropsychological interventions.  The patient has a history of migraine headaches, multiple back surgeries including lumbar fusion and cervical fusion and the development and worsening of severe pain symptoms over the past year.  The patient is having a lot of stress and coping difficulties and sleep disturbance related to the severe pain.  The patient reports that she simply cannot function out due to pain.  Disposition/Plan:  We are continuing to work on coping skills and strategies around chronic pain syndrome subsequent to failed back syndrome.  The patient reports that her mood is been fairly stable but she continues to have constant pain.  I gave her information for Dr. Ollen BowlHarkins office for consideration for spinal cord stimulator trialing.  Diagnosis:    Right lumbar radiculitis  Failed back syndrome  Sleep disturbance  Chronic pain syndrome         Electronically Signed   _______________________ Arley PhenixJohn Caydon Feasel, Psy.D.

## 2017-09-24 ENCOUNTER — Encounter: Payer: Self-pay | Admitting: Psychology

## 2017-09-24 ENCOUNTER — Encounter: Payer: Medicare Other | Attending: Physical Medicine & Rehabilitation | Admitting: Psychology

## 2017-09-24 DIAGNOSIS — M961 Postlaminectomy syndrome, not elsewhere classified: Secondary | ICD-10-CM

## 2017-09-24 DIAGNOSIS — Z803 Family history of malignant neoplasm of breast: Secondary | ICD-10-CM | POA: Diagnosis not present

## 2017-09-24 DIAGNOSIS — Z8249 Family history of ischemic heart disease and other diseases of the circulatory system: Secondary | ICD-10-CM | POA: Insufficient documentation

## 2017-09-24 DIAGNOSIS — G894 Chronic pain syndrome: Secondary | ICD-10-CM | POA: Diagnosis not present

## 2017-09-24 DIAGNOSIS — G43909 Migraine, unspecified, not intractable, without status migrainosus: Secondary | ICD-10-CM | POA: Insufficient documentation

## 2017-09-24 DIAGNOSIS — M1990 Unspecified osteoarthritis, unspecified site: Secondary | ICD-10-CM | POA: Diagnosis not present

## 2017-09-24 DIAGNOSIS — M5416 Radiculopathy, lumbar region: Secondary | ICD-10-CM

## 2017-09-24 DIAGNOSIS — G479 Sleep disorder, unspecified: Secondary | ICD-10-CM

## 2017-09-24 NOTE — Progress Notes (Signed)
Neuropsychological Consultation   Patient:   Gina Higgins   DOB:   [birth year]  MR Number:  [id number]  Location:  [facility] FOR PAIN AND [facility] MEDICINE [facility] PHYSICAL MEDICINE AND REHABILITATION [address], [place] [address] [id number] [place] [address] Dept: [phone]           Date of Service:   09/24/2017  Start Time:   8 AM End Time:   9 AM  Provider/Observer:  Arley Phenix, Psy.D.       Clinical Neuropsychologist       Billing Code/Service: 40102 4 Units  Chief Complaint:    Gina Higgins is a 72 year old female referred by Dr. Allena Katz for psychological/neuropsychological interventions.  The patient has a history of migraine headaches, multiple back surgeries including lumbar fusion and cervical fusion and the development and worsening of severe pain symptoms over the past year.  The patient is having a lot of stress and coping difficulties and sleep disturbance related to the severe pain.  The patient reports that she simply cannot function out due to pain.  Reason for Service:  Gina Higgins was referred by Dr. Allena Katz for psychological/neuropsychological interventions.  The patient reports severe significant pain and the fact that various medications do not seem to really help with her pain.  She reports that this is keeping her from functioning.  The patient is tried a number of interventions but insists that the only medicine that really seems to benefit her is Percocet.  The patient reports that when she is without any medications that she has tried marijuana on at least one occasion and prior urinalysis drug screen was consistent with alcohol, THC and bupropion.  Current Status:  The patient reports that she has been free from any substance use and is not taking any controlled substance prescription medications.  She reports that she has continued pain but is willing to wait to see if she is a good for trial of SCS.  Behavioral  Observation: Gina Higgins  presents as a 72 y.o.-year-old Right African American Female who appeared her stated age. her dress was Appropriate and she was Well Groomed and her manners were Appropriate to the situation.  her participation was indicative of Appropriate and Attentive behaviors.  There were any physical disabilities noted.  she displayed an appropriate level of cooperation and motivation.     Interactions:    Active Appropriate and Redirectable  Attention:   within normal limits and attention span and concentration were age appropriate  Memory:   within normal limits; recent and remote memory intact  Visuo-spatial:  not examined  Speech (Volume):  normal  Speech:   normal; normal  Thought Process:  Coherent and Relevant  Though Content:  WNL; not suicidal  Orientation:   person, place, time/date and situation  Judgment:   Fair  Planning:   Fair  Affect:    Irritable  Mood:    Dysphoric  Insight:   Fair  Intelligence:   normal  Marital Status/Living: The patient was born and raised in [place].  She is single.  She has 4 children age 73-50.  Current Employment: The patient is not working at the current time and is retired.  Past Employment:  The patient reports that she worked throughout her life.  Substance Use:  There is a documented history of prescription drug abuse confirmed by the patient.the patient does not appear to have had any type of specific abuse but has used prescription pain medicines when  they were specifically prescribed to her.  Patient is not using any pain medications or non-prescribed medications by report.  Education:   HS Graduate  Medical History:   Past Medical History:  Diagnosis Date  . Arthritis   . Heart murmur   . History of chicken pox   . Migraines            Psychiatric History:  Patient denies any prior psychiatric history.  Family Med/Psych History:  Family History  Problem Relation Age of Onset  .  Hypertension Mother   . Breast cancer Mother   . Gout Father   . Hypertension Father   . Hypertension Maternal Grandmother   . Hypertension Maternal Grandfather     Risk of Suicide/Violence: low the patient denies any suicidal or homicidal ideation.  Impression/DX:  Gina Higgins is a 72 year old female referred by Dr. Allena KatzPatel for psychological/neuropsychological interventions.  The patient has a history of migraine headaches, multiple back surgeries including lumbar fusion and cervical fusion and the development and worsening of severe pain symptoms over the past year.  The patient reports that she is doing much better with regard to mood and outlook.  Disposition/Plan:  We are continuing to work on coping skills and strategies around chronic pain syndrome subsequent to failed back syndrome.  The patient reports that her mood is been fairly stable but she continues to have constant pain.  I gave her information for Dr. Ollen BowlHarkins office for consideration for spinal cord stimulator trialing.  She is also given the MMPI2 and the P3 to complete so we have them for Dr. Ollen BowlHarkins.  Diagnosis:    Right lumbar radiculitis  Failed back syndrome  Sleep disturbance  Chronic pain syndrome         Electronically Signed   _______________________ Arley PhenixJohn Laquon Emel, Psy.D.

## 2017-10-01 ENCOUNTER — Encounter (HOSPITAL_COMMUNITY): Payer: Self-pay | Admitting: Emergency Medicine

## 2017-10-01 ENCOUNTER — Emergency Department (HOSPITAL_COMMUNITY)
Admission: EM | Admit: 2017-10-01 | Discharge: 2017-10-01 | Disposition: A | Discharge status: Home / Self Care | Payer: Medicare Other | Attending: Emergency Medicine | Admitting: Emergency Medicine

## 2017-10-01 DIAGNOSIS — Z96651 Presence of right artificial knee joint: Secondary | ICD-10-CM | POA: Diagnosis not present

## 2017-10-01 DIAGNOSIS — Z79899 Other long term (current) drug therapy: Secondary | ICD-10-CM | POA: Insufficient documentation

## 2017-10-01 DIAGNOSIS — L282 Other prurigo: Secondary | ICD-10-CM | POA: Diagnosis not present

## 2017-10-01 DIAGNOSIS — Z87891 Personal history of nicotine dependence: Secondary | ICD-10-CM | POA: Insufficient documentation

## 2017-10-01 DIAGNOSIS — R21 Rash and other nonspecific skin eruption: Secondary | ICD-10-CM | POA: Diagnosis not present

## 2017-10-01 MED ORDER — HYDROCORTISONE 1 % EX CREA: TOPICAL_CREAM | CUTANEOUS | 0 refills | Status: DC

## 2017-10-01 MED ORDER — HYDROXYZINE HCL 25 MG PO TABS: 12.5000 mg | ORAL_TABLET | Freq: Three times a day (TID) | ORAL | 0 refills | Status: DC | PRN

## 2017-10-01 NOTE — ED Provider Notes (Signed)
[facility] EMERGENCY DEPARTMENT Provider Note   CSN: [id number] Arrival date & time: 10/01/17  1543     History   Chief Complaint Chief Complaint  Patient presents with  . Rash    HPI Gina Higgins is a 72 y.o. female.  Patient reports sudden onset of pruritic rash on face, both elbows, right foot. No known exposure. No difficulty breathing or nausea.    The history is provided by the patient. No language interpreter was used.  Rash   This is a new problem. The current episode started 2 days ago. The problem has been gradually worsening. The problem is associated with nothing. There has been no fever. The rash is present on the left arm, right arm and right foot. The patient is experiencing no pain. Associated symptoms include itching. Pertinent negatives include no weeping. She has tried nothing for the symptoms.    Past Medical History:  Diagnosis Date  . Arthritis   . Heart murmur   . History of chicken pox   . Migraines     Patient Active Problem List   Diagnosis Date Noted  . Chronic neck pain 09/11/2016  . Low back pain 09/11/2016  . Migraine 09/11/2016    Past Surgical History:  Procedure Laterality Date  . ABDOMINAL HYSTERECTOMY     Partial  . APPENDECTOMY    . BACK SURGERY    . BREAST SURGERY     Total mastectomy  . FOOT SURGERY    . NECK SURGERY    . REPLACEMENT TOTAL KNEE Right     OB History    No data available       Home Medications    Prior to Admission medications   Medication Sig Start Date End Date Taking? Authorizing Provider  amitriptyline (ELAVIL) 75 MG tablet Take 1 tablet (75 mg total) by mouth at bedtime. 05/08/17   Marcello Fennel, MD  diclofenac sodium (VOLTAREN) 1 % GEL Apply 2 g topically 4 (four) times daily. 09/11/16   Veryl Speak, FNP  DULoxetine (CYMBALTA) 60 MG capsule Take 1 capsule (60 mg total) by mouth daily. 01/17/17   Marcello Fennel, MD  gabapentin (NEURONTIN) 600 MG tablet Take 1.5  tablets (900 mg total) by mouth 3 (three) times daily. 03/14/17 04/13/17  Marcello Fennel, MD  hydrocortisone cream 1 % Apply to affected area 2 times daily 06/11/17   Kellie Shropshire, PA-C  meloxicam (MOBIC) 15 MG tablet Take 1 tablet (15 mg total) by mouth daily. 02/14/17   Marcello Fennel, MD  methocarbamol (ROBAXIN) 500 MG tablet Take 2 tablets (1,000 mg total) by mouth every 8 (eight) hours as needed for muscle spasms. 02/14/17   Marcello Fennel, MD  tiZANidine (ZANAFLEX) 4 MG tablet Take 1-2 tablets (4-8 mg total) by mouth every 8 (eight) hours as needed for muscle spasms. 11/09/16   Veryl Speak, FNP    Family History Family History  Problem Relation Age of Onset  . Hypertension Mother   . Breast cancer Mother   . Gout Father   . Hypertension Father   . Hypertension Maternal Grandmother   . Hypertension Maternal Grandfather     Social History Social History   Tobacco Use  . Smoking status: Former Smoker    Packs/day: 1.00    Years: 50.00    Pack years: 50.00    Types: Cigarettes    Last attempt to quit: 08/13/2016    Years since quitting: 1.1  .  Smokeless tobacco: Never Used  Substance Use Topics  . Alcohol use: Yes    Alcohol/week: 1.8 oz    Types: 2 Glasses of wine, 1 Cans of beer per week  . Drug use: Not on file     Allergies   Compazine [prochlorperazine edisylate]; Pineapple; and Tramadol   Review of Systems Review of Systems  Skin: Positive for itching and rash.  All other systems reviewed and are negative.    Physical Exam Updated Vital Signs BP 134/86 (BP Location: Right Arm)   Pulse 65   Temp (!) 97 F (36.1 C) (Oral)   Resp 16   Ht 5' 5.5" (1.664 m)   Wt 67.6 kg (149 lb)   SpO2 100%   BMI 24.42 kg/m   Physical Exam  Constitutional: She appears well-developed and well-nourished.  HENT:  Head: Normocephalic.  Mouth/Throat: Oropharynx is clear and moist.  Eyes: Conjunctivae are normal.  Neck: Neck supple.  Cardiovascular:  Normal rate and regular rhythm.  Pulmonary/Chest: Effort normal and breath sounds normal.  Abdominal: Soft. Bowel sounds are normal.  Musculoskeletal: Normal range of motion.  Skin: Skin is warm and dry. Rash noted.  Psychiatric: She has a normal mood and affect.  Nursing note and vitals reviewed.    ED Treatments / Results  Labs (all labs ordered are listed, but only abnormal results are displayed) Labs Reviewed - No data to display  EKG  EKG Interpretation None       Radiology No results found.  Procedures Procedures (including critical care time)  Medications Ordered in ED Medications - No data to display   Initial Impression / Assessment and Plan / ED Course  I have reviewed the triage vital signs and the nursing notes.  Pertinent labs & imaging results that were available during my care of the patient were reviewed by me and considered in my medical decision making (see chart for details).     Patient discussed with Dr. Rush Landmarkegeler, agrees with treatment plan.  Patient with nonspecific eruption. No signs of infection. Discharge with symptomatic treatment. Follow up with PCP in 2-3 days.       Final Clinical Impressions(s) / ED Diagnoses   Final diagnoses:  Pruritic rash    ED Discharge Orders        Ordered    hydrOXYzine (ATARAX/VISTARIL) 25 MG tablet  Every 8 hours PRN     10/01/17 1801    hydrocortisone cream 1 %     10/01/17 1801       Felicie MornSmith, Lucero Ide, NP 10/01/17 1923    Tegeler, Canary Brimhristopher J, MD 10/02/17 [id number]

## 2017-10-01 NOTE — ED Triage Notes (Signed)
Pt reports itchy rash to face, arms, and legs x 2 days. States "it feels like something is crawling underneath my skin." Raised reddened rash noted to L cheek. Patient denies new lotions/body washes or medications. Denies fevers/chills.

## 2017-11-19 ENCOUNTER — Encounter: Payer: Medicare Other | Admitting: Psychology

## 2018-03-20 ENCOUNTER — Encounter (HOSPITAL_COMMUNITY): Payer: Self-pay | Admitting: Emergency Medicine

## 2018-03-20 ENCOUNTER — Emergency Department (HOSPITAL_COMMUNITY)
Admission: EM | Admit: 2018-03-20 | Discharge: 2018-03-20 | Disposition: A | Discharge status: Home / Self Care | Payer: Medicare Other | Attending: Emergency Medicine | Admitting: Emergency Medicine

## 2018-03-20 ENCOUNTER — Emergency Department (HOSPITAL_COMMUNITY): Payer: Medicare Other

## 2018-03-20 ENCOUNTER — Other Ambulatory Visit: Payer: Self-pay

## 2018-03-20 DIAGNOSIS — R071 Chest pain on breathing: Secondary | ICD-10-CM | POA: Insufficient documentation

## 2018-03-20 DIAGNOSIS — R509 Fever, unspecified: Secondary | ICD-10-CM | POA: Diagnosis not present

## 2018-03-20 DIAGNOSIS — R05 Cough: Secondary | ICD-10-CM | POA: Diagnosis not present

## 2018-03-20 DIAGNOSIS — Z96651 Presence of right artificial knee joint: Secondary | ICD-10-CM | POA: Insufficient documentation

## 2018-03-20 DIAGNOSIS — R0602 Shortness of breath: Secondary | ICD-10-CM | POA: Diagnosis present

## 2018-03-20 DIAGNOSIS — Z87891 Personal history of nicotine dependence: Secondary | ICD-10-CM | POA: Insufficient documentation

## 2018-03-20 DIAGNOSIS — J40 Bronchitis, not specified as acute or chronic: Secondary | ICD-10-CM | POA: Insufficient documentation

## 2018-03-20 DIAGNOSIS — R0789 Other chest pain: Secondary | ICD-10-CM | POA: Diagnosis not present

## 2018-03-20 DIAGNOSIS — M549 Dorsalgia, unspecified: Secondary | ICD-10-CM | POA: Diagnosis not present

## 2018-03-20 DIAGNOSIS — R0781 Pleurodynia: Secondary | ICD-10-CM | POA: Diagnosis not present

## 2018-03-20 LAB — BASIC METABOLIC PANEL
ANION GAP: 10 (ref 5–15)
BUN: 16 mg/dL (ref 8–23)
CALCIUM: 10.4 mg/dL — AB (ref 8.9–10.3)
CO2: 22 mmol/L (ref 22–32)
Chloride: 108 mmol/L (ref 98–111)
Creatinine, Ser: 1.36 mg/dL — ABNORMAL HIGH (ref 0.44–1.00)
GFR, EST AFRICAN AMERICAN: 43 mL/min — AB (ref >60)
GFR, EST NON AFRICAN AMERICAN: 37 mL/min — AB (ref >60)
Glucose, Bld: 97 mg/dL (ref 70–99)
POTASSIUM: 3.6 mmol/L (ref 3.5–5.1)
SODIUM: 140 mmol/L (ref 135–145)

## 2018-03-20 LAB — CBC
HEMATOCRIT: 43.7 % (ref 36.0–46.0)
HEMOGLOBIN: 14 g/dL (ref 12.0–15.0)
MCH: 30 pg (ref 26.0–34.0)
MCHC: 32 g/dL (ref 30.0–36.0)
MCV: 93.6 fL (ref 78.0–100.0)
Platelets: 215 10*3/uL (ref 150–400)
RBC: 4.67 MIL/uL (ref 3.87–5.11)
RDW: 14.1 % (ref 11.5–15.5)
WBC: 7.3 10*3/uL (ref 4.0–10.5)

## 2018-03-20 LAB — I-STAT TROPONIN, ED: TROPONIN I, POC: 0 ng/mL (ref 0.00–0.08)

## 2018-03-20 LAB — D-DIMER, QUANTITATIVE (NOT AT ARMC): D DIMER QUANT: 0.85 ug{FEU}/mL — AB (ref 0.00–0.50)

## 2018-03-20 MED ORDER — METHOCARBAMOL 500 MG PO TABS: 500.0000 mg | ORAL_TABLET | Freq: Three times a day (TID) | ORAL | 0 refills | Status: DC | PRN

## 2018-03-20 MED ORDER — IOPAMIDOL (ISOVUE-370) INJECTION 76%
INTRAVENOUS | Status: AC
  Administered 2018-03-20: 75 mL
  Filled 2018-03-20: qty 100

## 2018-03-20 MED ORDER — BENZONATATE 100 MG PO CAPS: 100.0000 mg | ORAL_CAPSULE | Freq: Three times a day (TID) | ORAL | 0 refills | Status: DC | PRN

## 2018-03-20 MED ORDER — AZITHROMYCIN 250 MG PO TABS: 250.0000 mg | ORAL_TABLET | Freq: Every day | ORAL | 0 refills | Status: DC

## 2018-03-20 MED ORDER — METHOCARBAMOL 500 MG PO TABS
500.0000 mg | ORAL_TABLET | Freq: Once | ORAL | Status: AC
  Administered 2018-03-20: 500 mg via ORAL
  Filled 2018-03-20: qty 1

## 2018-03-20 MED ORDER — ACETAMINOPHEN 325 MG PO TABS
650.0000 mg | ORAL_TABLET | ORAL | Status: DC | PRN
  Administered 2018-03-20: 650 mg via ORAL
  Filled 2018-03-20: qty 2

## 2018-03-20 MED ORDER — SODIUM CHLORIDE 0.9 % IV BOLUS
500.0000 mL | Freq: Once | INTRAVENOUS | Status: AC
  Administered 2018-03-20: 500 mL via INTRAVENOUS

## 2018-03-20 NOTE — ED Provider Notes (Signed)
[facility] EMERGENCY DEPARTMENT Provider Note   CSN: [id number] Arrival date & time: 03/20/18  8413     History   Chief Complaint Chief Complaint  Patient presents with  . Shortness of Breath    HPI Gina Higgins is a 72 y.o. female.  HPI Patient presents with cough productive of yellow sputum for the past week.  She is had subjective fevers and chills.  Complains of chest pain rating through to her thoracic back which is worse with deep breathing and coughing.  States the pain is bilateral.  No new lower extremity swelling or pain. Past Medical History:  Diagnosis Date  . Arthritis   . Heart murmur   . History of chicken pox   . Migraines     Patient Active Problem List   Diagnosis Date Noted  . Chronic neck pain 09/11/2016  . Low back pain 09/11/2016  . Migraine 09/11/2016    Past Surgical History:  Procedure Laterality Date  . ABDOMINAL HYSTERECTOMY     Partial  . APPENDECTOMY    . BACK SURGERY    . BREAST SURGERY     Total mastectomy  . FOOT SURGERY    . NECK SURGERY    . REPLACEMENT TOTAL KNEE Right      OB History   None      Home Medications    Prior to Admission medications   Medication Sig Start Date End Date Taking? Authorizing Provider  amitriptyline (ELAVIL) 75 MG tablet Take 1 tablet (75 mg total) by mouth at bedtime. Patient not taking: Reported on 03/20/2018 05/08/17   Marcello Fennel, MD  azithromycin (ZITHROMAX) 250 MG tablet Take 1 tablet (250 mg total) by mouth daily. Take first 2 tablets together, then 1 every day until finished. 03/20/18   Loren Racer, MD  benzonatate (TESSALON) 100 MG capsule Take 1 capsule (100 mg total) by mouth 3 (three) times daily as needed for cough. 03/20/18   Loren Racer, MD  diclofenac sodium (VOLTAREN) 1 % GEL Apply 2 g topically 4 (four) times daily. Patient not taking: Reported on 03/20/2018 09/11/16   Veryl Speak, FNP  DULoxetine (CYMBALTA) 60 MG capsule Take 1 capsule (60  mg total) by mouth daily. Patient not taking: Reported on 03/20/2018 01/17/17   Marcello Fennel, MD  gabapentin (NEURONTIN) 600 MG tablet Take 1.5 tablets (900 mg total) by mouth 3 (three) times daily. Patient not taking: Reported on 03/20/2018 03/14/17 04/13/17  Marcello Fennel, MD  hydrocortisone cream 1 % Apply to affected area 2 times daily Patient not taking: Reported on 03/20/2018 06/11/17   Kellie Shropshire, PA-C  hydrocortisone cream 1 % Apply to affected area 2 times daily Patient not taking: Reported on 03/20/2018 10/01/17   Felicie Morn, NP  hydrOXYzine (ATARAX/VISTARIL) 25 MG tablet Take 0.5 tablets (12.5 mg total) by mouth every 8 (eight) hours as needed. Patient not taking: Reported on 03/20/2018 10/01/17   Felicie Morn, NP  meloxicam (MOBIC) 15 MG tablet Take 1 tablet (15 mg total) by mouth daily. Patient not taking: Reported on 03/20/2018 02/14/17   Marcello Fennel, MD  methocarbamol (ROBAXIN) 500 MG tablet Take 1 tablet (500 mg total) by mouth every 8 (eight) hours as needed for muscle spasms. 03/20/18   Loren Racer, MD  tiZANidine (ZANAFLEX) 4 MG tablet Take 1-2 tablets (4-8 mg total) by mouth every 8 (eight) hours as needed for muscle spasms. Patient not taking: Reported on 03/20/2018 11/09/16   Calone,  Tama Headings, FNP    Family History Family History  Problem Relation Age of Onset  . Hypertension Mother   . Breast cancer Mother   . Gout Father   . Hypertension Father   . Hypertension Maternal Grandmother   . Hypertension Maternal Grandfather     Social History Social History   Tobacco Use  . Smoking status: Former Smoker    Packs/day: 1.00    Years: 50.00    Pack years: 50.00    Types: Cigarettes    Last attempt to quit: 08/13/2016    Years since quitting: 1.6  . Smokeless tobacco: Never Used  Substance Use Topics  . Alcohol use: Yes    Alcohol/week: 1.8 oz    Types: 2 Glasses of wine, 1 Cans of beer per week  . Drug use: Not on file     Allergies   Compazine  [prochlorperazine edisylate]; Pineapple; and Tramadol   Review of Systems Review of Systems  Constitutional: Positive for chills and fever. Negative for fatigue.  HENT: Positive for rhinorrhea. Negative for sore throat and trouble swallowing.   Respiratory: Positive for cough and shortness of breath.   Cardiovascular: Positive for chest pain. Negative for palpitations and leg swelling.  Gastrointestinal: Negative for abdominal pain, constipation, diarrhea, nausea and vomiting.  Genitourinary: Negative for dysuria and flank pain.  Musculoskeletal: Positive for back pain and myalgias. Negative for neck pain and neck stiffness.  Skin: Negative for rash and wound.  Neurological: Negative for dizziness, weakness, light-headedness, numbness and headaches.  All other systems reviewed and are negative.    Physical Exam Updated Vital Signs BP 128/78   Pulse (!) 55   Resp 15   SpO2 100%   Physical Exam  Constitutional: She is oriented to person, place, and time. She appears well-developed and well-nourished. No distress.  HENT:  Head: Normocephalic and atraumatic.  Mouth/Throat: Oropharynx is clear and moist.  Rhinorrhea  Eyes: Pupils are equal, round, and reactive to light. EOM are normal.  Neck: Normal range of motion. Neck supple. No JVD present.  Cardiovascular: Normal rate and regular rhythm. Exam reveals no gallop and no friction rub.  No murmur heard. Pulmonary/Chest: Effort normal.  Mildly diminished breath sounds bilateral bases.  Abdominal: Soft. Bowel sounds are normal. There is no tenderness. There is no rebound and no guarding.  Musculoskeletal: Normal range of motion. She exhibits no edema or tenderness.  No lower extremity swelling, asymmetry or tenderness.  No midline thoracic or lumbar tenderness.  No CVA tenderness bilaterally.  Lymphadenopathy:    She has no cervical adenopathy.  Neurological: She is alert and oriented to person, place, and time.  Moves all  extremities without focal deficit.  Sensation intact.  Skin: Skin is warm and dry. Capillary refill takes less than 2 seconds. No rash noted. She is not diaphoretic. No erythema.  Psychiatric: She has a normal mood and affect. Her behavior is normal.  Nursing note and vitals reviewed.    ED Treatments / Results  Labs (all labs ordered are listed, but only abnormal results are displayed) Labs Reviewed  BASIC METABOLIC PANEL - Abnormal; Notable for the following components:      Result Value   Creatinine, Ser 1.36 (*)    Calcium 10.4 (*)    GFR calc non Af Amer 37 (*)    GFR calc Af Amer 43 (*)    All other components within normal limits  D-DIMER, QUANTITATIVE (NOT AT [facility]) - Abnormal; Notable for the following  components:   D-Dimer, Quant 0.85 (*)    All other components within normal limits  CBC  I-STAT TROPONIN, ED    EKG EKG Interpretation  Date/Time:  Thursday March 20 2018 08:01:46 EDT Ventricular Rate:  84 PR Interval:  182 QRS Duration: 80 QT Interval:  364 QTC Calculation: 430 R Axis:   66 Text Interpretation:  Normal sinus rhythm Biatrial enlargement Abnormal ECG Confirmed by Loren Racer ([id number]) on 03/20/2018 9:07:35 AM   Radiology Dg Chest 2 View  Result Date: 03/20/2018 CLINICAL DATA:  Pt c/o of cp, cough, and sob for 3 days. Denies dizziness. Former smoker. EXAM: CHEST - 2 VIEW COMPARISON:  None. FINDINGS: Heart size is normal. Aorta is tortuous. There are no focal consolidations or pleural effusions. No pulmonary edema. Degenerative changes are seen in thoracic spine. Partially imaged cervical and lumbar fusion. IMPRESSION: No evidence for acute cardiopulmonary abnormality. Electronically Signed   By: Norva Pavlov M.D.   On: 03/20/2018 08:47   Ct Angio Chest Pe W And/or Wo Contrast  Result Date: 03/20/2018 CLINICAL DATA:  Productive cough.  Rule out pulmonary embolism. EXAM: CT ANGIOGRAPHY CHEST WITH CONTRAST TECHNIQUE: Multidetector CT imaging of the  chest was performed using the standard protocol during bolus administration of intravenous contrast. Multiplanar CT image reconstructions and MIPs were obtained to evaluate the vascular anatomy. CONTRAST:  75mL ISOVUE-370 IOPAMIDOL (ISOVUE-370) INJECTION 76% COMPARISON:  Chest two-view 03/20/2018 FINDINGS: Cardiovascular: Negative for pulmonary embolism. Pulmonary arteries normal in caliber. Mild atherosclerotic disease in the aortic arch without aneurysm. Aorta not significantly opacified. Heart size normal. No pericardial effusion. Slight coronary calcification. Mediastinum/Nodes: Negative for mass or adenopathy. Normal esophagus. Lungs/Pleura: Lungs are clear.  No infiltrate effusion or mass. Upper Abdomen: Small hepatic cysts. No acute abnormality. 15 mm left renal cyst. Musculoskeletal: No acute skeletal abnormality. Review of the MIP images confirms the above findings. IMPRESSION: Negative for pulmonary embolism.  No acute abnormality in the chest. Aortic Atherosclerosis (ICD10-I70.0). Electronically Signed   By: Marlan Palau M.D.   On: 03/20/2018 11:28    Procedures Procedures (including critical care time)  Medications Ordered in ED Medications  acetaminophen (TYLENOL) tablet 650 mg (650 mg Oral Given 03/20/18 0857)  methocarbamol (ROBAXIN) tablet 500 mg (500 mg Oral Given 03/20/18 0857)  sodium chloride 0.9 % bolus 500 mL (0 mLs Intravenous Stopped 03/20/18 1051)  iopamidol (ISOVUE-370) 76 % injection (75 mLs  Contrast Given 03/20/18 1115)     Initial Impression / Assessment and Plan / ED Course  I have reviewed the triage vital signs and the nursing notes.  Pertinent labs & imaging results that were available during my care of the patient were reviewed by me and considered in my medical decision making (see chart for details).     Chest pain is pleuritic in nature.  Has infectious symptoms including productive cough.  X-ray without acute findings.  Patient did elevation in her d-dimer.  CT  angio chest without acute findings.  Given prolonged symptoms will start on antibiotic and treat chest pain symptomatically.  Advised to follow-up with her primary physician and return precautions given.  Final Clinical Impressions(s) / ED Diagnoses   Final diagnoses:  Bronchitis  Atypical chest pain    ED Discharge Orders        Ordered    azithromycin (ZITHROMAX) 250 MG tablet  Daily     03/20/18 1221    methocarbamol (ROBAXIN) 500 MG tablet  Every 8 hours PRN     03/20/18 1221  benzonatate (TESSALON) 100 MG capsule  3 times daily PRN     03/20/18 1221       Loren RacerYelverton, Nyasha Rahilly, MD 03/20/18 1223

## 2018-03-20 NOTE — ED Notes (Signed)
Patient transported to X-ray 

## 2018-03-20 NOTE — ED Triage Notes (Signed)
Pt arrives to ED with c/o of feeling like she has recurrent pneumonia. Pt states for the last 3 days she has been having a productive cough with sob and back pain with coughing.

## 2018-03-20 NOTE — ED Notes (Signed)
Patient transported to CT 

## 2019-05-12 DIAGNOSIS — Z79899 Other long term (current) drug therapy: Secondary | ICD-10-CM | POA: Diagnosis not present

## 2019-05-12 DIAGNOSIS — Z0000 Encounter for general adult medical examination without abnormal findings: Secondary | ICD-10-CM | POA: Diagnosis not present

## 2019-07-31 ENCOUNTER — Ambulatory Visit
Admission: RE | Admit: 2019-07-31 | Discharge: 2019-07-31 | Disposition: A | Discharge status: Home / Self Care | Payer: Medicare Other | Source: Ambulatory Visit | Attending: Family | Admitting: Family

## 2019-07-31 ENCOUNTER — Other Ambulatory Visit: Payer: Self-pay | Admitting: Family

## 2019-07-31 DIAGNOSIS — M25561 Pain in right knee: Secondary | ICD-10-CM | POA: Diagnosis not present

## 2020-07-26 ENCOUNTER — Other Ambulatory Visit: Payer: Self-pay

## 2020-07-26 ENCOUNTER — Ambulatory Visit
Admission: RE | Admit: 2020-07-26 | Discharge: 2020-07-26 | Disposition: A | Discharge status: Home / Self Care | Payer: Medicare Other | Source: Ambulatory Visit | Attending: Family | Admitting: Family

## 2020-07-26 ENCOUNTER — Other Ambulatory Visit: Payer: Self-pay | Admitting: Family

## 2020-07-26 DIAGNOSIS — R52 Pain, unspecified: Secondary | ICD-10-CM

## 2021-02-13 ENCOUNTER — Encounter (HOSPITAL_COMMUNITY): Payer: Self-pay | Admitting: Emergency Medicine

## 2021-02-13 ENCOUNTER — Other Ambulatory Visit: Payer: Self-pay

## 2021-02-13 ENCOUNTER — Emergency Department (HOSPITAL_COMMUNITY)
Admission: EM | Admit: 2021-02-13 | Discharge: 2021-02-13 | Disposition: A | Discharge status: Home / Self Care | Payer: Medicare Other | Attending: Emergency Medicine | Admitting: Emergency Medicine

## 2021-02-13 DIAGNOSIS — Z87891 Personal history of nicotine dependence: Secondary | ICD-10-CM | POA: Insufficient documentation

## 2021-02-13 DIAGNOSIS — Z96651 Presence of right artificial knee joint: Secondary | ICD-10-CM | POA: Diagnosis not present

## 2021-02-13 DIAGNOSIS — L509 Urticaria, unspecified: Secondary | ICD-10-CM | POA: Diagnosis not present

## 2021-02-13 DIAGNOSIS — R21 Rash and other nonspecific skin eruption: Secondary | ICD-10-CM | POA: Diagnosis present

## 2021-02-13 MED ORDER — PREDNISONE 10 MG PO TABS: 40.0000 mg | ORAL_TABLET | Freq: Every day | ORAL | 0 refills | Status: AC

## 2021-02-13 MED ORDER — FAMOTIDINE 20 MG PO TABS: 20.0000 mg | ORAL_TABLET | Freq: Two times a day (BID) | ORAL | 0 refills | Status: AC

## 2021-02-13 MED ORDER — PREDNISONE 20 MG PO TABS
50.0000 mg | ORAL_TABLET | Freq: Once | ORAL | Status: AC
  Administered 2021-02-13: 07:00:00 50 mg via ORAL
  Filled 2021-02-13: qty 3

## 2021-02-13 MED ORDER — FAMOTIDINE 20 MG PO TABS
20.0000 mg | ORAL_TABLET | Freq: Once | ORAL | Status: AC
  Administered 2021-02-13: 07:00:00 20 mg via ORAL
  Filled 2021-02-13: qty 1

## 2021-02-13 NOTE — ED Provider Notes (Signed)
[facility] EMERGENCY DEPARTMENT Provider Note   CSN: [id number] Arrival date & time: 02/13/21  6789     History Chief Complaint  Patient presents with   Urticaria    Gina Higgins is a 75 y.o. female with a hx of migraines, arthritis, and chronic neck pain who presents to the ED with complaints of a rash x 3 days. Patient reports progressively worsening pruritic rash to her trunk/extremities, trying benadryl and topical hydrocortisone without relief. Was at a wedding  evening prior to onset of symptoms, but does not recall any new exposures/products.  No hx of same. Denies fever, chills, cough, dyspnea, sensation of throat closing, nausea, vomiting, or recent tic exposures.   HPI     Past Medical History:  Diagnosis Date   Arthritis    Heart murmur    History of chicken pox    Migraines     Patient Active Problem List   Diagnosis Date Noted   Chronic neck pain 09/11/2016   Low back pain 09/11/2016   Migraine 09/11/2016    Past Surgical History:  Procedure Laterality Date   ABDOMINAL HYSTERECTOMY     Partial   APPENDECTOMY     BACK SURGERY     BREAST SURGERY     Total mastectomy   FOOT SURGERY     NECK SURGERY     REPLACEMENT TOTAL KNEE Right      OB History   No obstetric history on file.     Family History  Problem Relation Age of Onset   Hypertension Mother    Breast cancer Mother    Gout Father    Hypertension Father    Hypertension Maternal Grandmother    Hypertension Maternal Grandfather     Social History   Tobacco Use   Smoking status: Former    Packs/day: 1.00    Years: 50.00    Pack years: 50.00    Types: Cigarettes    Quit date: 08/13/2016    Years since quitting: 4.5   Smokeless tobacco: Never  Vaping Use   Vaping Use: Never used  Substance Use Topics   Alcohol use: Yes    Alcohol/week: 3.0 standard drinks    Types: 2 Glasses of wine, 1 Cans of beer per week    Home Medications Prior to Admission  medications   Medication Sig Start Date End Date Taking? Authorizing Provider  amitriptyline (ELAVIL) 75 MG tablet Take 1 tablet (75 mg total) by mouth at bedtime. Patient not taking: Reported on 03/20/2018 05/08/17   Marcello Fennel, MD  benzonatate (TESSALON) 100 MG capsule Take 1 capsule (100 mg total) by mouth 3 (three) times daily as needed for cough. 03/20/18   Loren Racer, MD  diclofenac sodium (VOLTAREN) 1 % GEL Apply 2 g topically 4 (four) times daily. Patient not taking: Reported on 03/20/2018 09/11/16   Veryl Speak, FNP  DULoxetine (CYMBALTA) 60 MG capsule Take 1 capsule (60 mg total) by mouth daily. Patient not taking: Reported on 03/20/2018 01/17/17   Marcello Fennel, MD  gabapentin (NEURONTIN) 600 MG tablet Take 1.5 tablets (900 mg total) by mouth 3 (three) times daily. Patient not taking: Reported on 03/20/2018 03/14/17 04/13/17  Marcello Fennel, MD  hydrocortisone cream 1 % Apply to affected area 2 times daily Patient not taking: Reported on 03/20/2018 06/11/17   Kellie Shropshire, PA-C  hydrocortisone cream 1 % Apply to affected area 2 times daily Patient not taking: Reported on 03/20/2018 10/01/17  Felicie Morn, NP  hydrOXYzine (ATARAX/VISTARIL) 25 MG tablet Take 0.5 tablets (12.5 mg total) by mouth every 8 (eight) hours as needed. Patient not taking: Reported on 03/20/2018 10/01/17   Felicie Morn, NP  meloxicam (MOBIC) 15 MG tablet Take 1 tablet (15 mg total) by mouth daily. Patient not taking: Reported on 03/20/2018 02/14/17   Marcello Fennel, MD  methocarbamol (ROBAXIN) 500 MG tablet Take 1 tablet (500 mg total) by mouth every 8 (eight) hours as needed for muscle spasms. 03/20/18   Loren Racer, MD  tiZANidine (ZANAFLEX) 4 MG tablet Take 1-2 tablets (4-8 mg total) by mouth every 8 (eight) hours as needed for muscle spasms. Patient not taking: Reported on 03/20/2018 11/09/16   Veryl Speak, FNP    Allergies    Compazine [prochlorperazine edisylate], Pineapple, and  Tramadol  Review of Systems   Review of Systems  Constitutional:  Negative for chills and fever.  HENT:  Negative for trouble swallowing and voice change.   Respiratory:  Negative for shortness of breath.   Cardiovascular:  Negative for chest pain.  Gastrointestinal:  Negative for abdominal pain, nausea and vomiting.  Skin:  Positive for rash.  Neurological:  Negative for syncope.  All other systems reviewed and are negative.  Physical Exam Updated Vital Signs BP 117/69 (BP Location: Left Arm)   Pulse (!) 56   Temp 98.4 F (36.9 C) (Oral)   Resp 18   Ht 5\' 5"  (1.651 m)   Wt 74 kg   SpO2 100%   BMI 27.15 kg/m   Physical Exam Vitals and nursing note reviewed.  Constitutional:      General: She is not in acute distress.    Appearance: She is well-developed. She is not toxic-appearing.  HENT:     Head: Normocephalic and atraumatic.     Mouth/Throat:     Mouth: No angioedema.     Pharynx: Oropharynx is clear. Uvula midline.     Comments: Posterior oropharynx is symmetric appearing. Patient tolerating own secretions without difficulty. No trismus. No drooling. No hot potato voice. No swelling beneath the tongue, submandibular compartment is soft.  Eyes:     General:        Right eye: No discharge.        Left eye: No discharge.     Conjunctiva/sclera: Conjunctivae normal.  Cardiovascular:     Rate and Rhythm: Normal rate and regular rhythm.  Pulmonary:     Effort: Pulmonary effort is normal. No respiratory distress.     Breath sounds: Normal breath sounds. No wheezing, rhonchi or rales.  Abdominal:     General: There is no distension.     Palpations: Abdomen is soft.     Tenderness: There is no abdominal tenderness.  Musculoskeletal:     Cervical back: Neck supple.  Skin:    General: Skin is warm and dry.     Findings: Rash (scattered to extremities and the trunk- mostly to the upper back) present. Rash is urticarial. Rash is not crusting, nodular, purpuric, pustular  or vesicular.     Comments: No palm/sole or MM involvement.   Neurological:     Mental Status: She is alert.     Comments: Clear speech.   Psychiatric:        Behavior: Behavior normal.    ED Results / Procedures / Treatments   Labs (all labs ordered are listed, but only abnormal results are displayed) Labs Reviewed - No data to display  EKG None  Radiology No results found.  Procedures Procedures   Medications Ordered in ED Medications - No data to display  ED Course  I have reviewed the triage vital signs and the nursing notes.  Pertinent labs & imaging results that were available during my care of the patient were reviewed by me and considered in my medical decision making (see chart for details).    MDM Rules/Calculators/A&P                         Patient presents to the ED with complaints of pruritic rash. She is nontoxic, vitals without significant abnormality.   Additional history obtained:  Additional history obtained from chart review & nursing note review.   ED Course:  Urticarial rash present. Airway intact & patent, no stridor. No signs of angioedema.  At this time do not suspect superimposed infection. Low suspicion for SJS, TEN, TSS, tick borne illness, syphilis or other life-threatening condition. Will discharge home with short course of steroids ( no hx of DM) and pepcid. PCP Follow up.   I discussed treatment plan, need for follow-up, and return precautions with the patient. Provided opportunity for questions, patient confirmed understanding and is in agreement with plan.   Portions of this note were generated with Scientist, clinical (histocompatibility and immunogenetics). Dictation errors may occur despite best attempts at proofreading.  Final Clinical Impression(s) / ED Diagnoses Final diagnoses:  Urticaria    Rx / DC Orders ED Discharge Orders          Ordered    predniSONE (DELTASONE) 10 MG tablet  Daily        02/13/21 0701    famotidine (PEPCID) 20 MG tablet  2 times  daily        02/13/21 0701             Santonio Speakman, Pleas Koch, PA-C 02/13/21 0716    Zadie Rhine, MD 02/13/21 [phone]

## 2021-02-13 NOTE — ED Triage Notes (Signed)
Patient reports generalized itchy skin rashes /hives onset 2 days ago unrelieved by OTC Benadryl and Cortizone cream , airway intact/ respirations unlabored.

## 2021-02-13 NOTE — Discharge Instructions (Addendum)
You were seen in the ER today for a rash which we suspect is urticaria/hives.   We would like you to continue taking benadryl per over the counter dosing.   We are sending you home with the following medications:  - Prednisone- this is a steroid, please take this daily with breakfast for the next 5 days.  - Pepcid- this is an antihistamine to help with itching/rash, take this twice per day for the next 2 weeks.   We have prescribed you new medication(s) today. Discuss the medications prescribed today with your pharmacist as they can have adverse effects and interactions with your other medicines including over the counter and prescribed medications. Seek medical evaluation if you start to experience new or abnormal symptoms after taking one of these medicines, seek care immediately if you start to experience difficulty breathing, feeling of your throat closing, facial swelling, or rash as these could be indications of a more serious allergic reaction   Please follow up with your primary care provider within the next 3 days for a recheck of your rash. Return to the ER sooner for new or worsening symptoms including but not limited to worsened rash, facial/oral swelling, sensation of throat closing, trouble breathing, fever, inability to keep fluids down, or any other concerns.

## 2021-02-16 ENCOUNTER — Other Ambulatory Visit: Payer: Self-pay

## 2021-02-16 ENCOUNTER — Encounter (HOSPITAL_COMMUNITY): Payer: Self-pay | Admitting: Emergency Medicine

## 2021-02-16 ENCOUNTER — Emergency Department (HOSPITAL_COMMUNITY)
Admission: EM | Admit: 2021-02-16 | Discharge: 2021-02-16 | Disposition: A | Discharge status: Home / Self Care | Payer: Medicare Other | Attending: Emergency Medicine | Admitting: Emergency Medicine

## 2021-02-16 DIAGNOSIS — Z87891 Personal history of nicotine dependence: Secondary | ICD-10-CM | POA: Insufficient documentation

## 2021-02-16 DIAGNOSIS — R21 Rash and other nonspecific skin eruption: Secondary | ICD-10-CM | POA: Diagnosis present

## 2021-02-16 DIAGNOSIS — L299 Pruritus, unspecified: Secondary | ICD-10-CM

## 2021-02-16 MED ORDER — HYDROXYZINE HCL 25 MG PO TABS
25.0000 mg | ORAL_TABLET | Freq: Once | ORAL | Status: AC
  Administered 2021-02-16: 25 mg via ORAL
  Filled 2021-02-16: qty 1

## 2021-02-16 MED ORDER — HYDROXYZINE HCL 25 MG PO TABS: 25.0000 mg | ORAL_TABLET | Freq: Four times a day (QID) | ORAL | 0 refills | Status: AC | PRN

## 2021-02-16 MED ORDER — DEXAMETHASONE SODIUM PHOSPHATE 10 MG/ML IJ SOLN
10.0000 mg | Freq: Once | INTRAMUSCULAR | Status: AC
  Administered 2021-02-16: 05:00:00 10 mg via INTRAMUSCULAR
  Filled 2021-02-16: qty 1

## 2021-02-16 NOTE — ED Provider Notes (Signed)
[facility] EMERGENCY DEPARTMENT Provider Note   CSN: [id number] Arrival date & time: 02/16/21  0241     History Chief Complaint  Patient presents with   Rash    Gina Higgins is a 75 y.o. female.  Patient is a 75 year old female with past medical history of migraines, arthritis.  Patient presents today for evaluation of itching.  She was seen here 2 days ago and diagnosed with urticaria.  She was prescribed Pepcid and prednisone, however is having little relief.  She denies any throat swelling or shortness of breath.  She denies any new contacts or exposures.  The history is provided by the patient.  Rash Location:  Full body Quality: itchiness and redness   Severity:  Severe Onset quality:  Gradual Duration:  3 days Timing:  Constant Progression:  Worsening Chronicity:  New Relieved by:  Nothing Worsened by:  Nothing Ineffective treatments:  Antihistamines     Past Medical History:  Diagnosis Date   Arthritis    Heart murmur    History of chicken pox    Migraines     Patient Active Problem List   Diagnosis Date Noted   Chronic neck pain 09/11/2016   Low back pain 09/11/2016   Migraine 09/11/2016    Past Surgical History:  Procedure Laterality Date   ABDOMINAL HYSTERECTOMY     Partial   APPENDECTOMY     BACK SURGERY     BREAST SURGERY     Total mastectomy   FOOT SURGERY     NECK SURGERY     REPLACEMENT TOTAL KNEE Right      OB History   No obstetric history on file.     Family History  Problem Relation Age of Onset   Hypertension Mother    Breast cancer Mother    Gout Father    Hypertension Father    Hypertension Maternal Grandmother    Hypertension Maternal Grandfather     Social History   Tobacco Use   Smoking status: Former    Packs/day: 1.00    Years: 50.00    Pack years: 50.00    Types: Cigarettes    Quit date: 08/13/2016    Years since quitting: 4.5   Smokeless tobacco: Never  Vaping Use   Vaping Use:  Never used  Substance Use Topics   Alcohol use: Yes    Alcohol/week: 3.0 standard drinks    Types: 2 Glasses of wine, 1 Cans of beer per week    Home Medications Prior to Admission medications   Medication Sig Start Date End Date Taking? Authorizing Provider  allopurinol (ZYLOPRIM) 100 MG tablet Take 100 mg by mouth daily. 12/08/20   [provider]  carisoprodol (SOMA) 350 MG tablet Take 350 mg by mouth 3 (three) times daily as needed for muscle spasms. 02/03/21   [provider]  famotidine (PEPCID) 20 MG tablet Take 1 tablet (20 mg total) by mouth 2 (two) times daily. 02/13/21   Petrucelli, Samantha R, PA-C  predniSONE (DELTASONE) 10 MG tablet Take 4 tablets (40 mg total) by mouth daily for 5 days. 02/13/21 02/18/21  Petrucelli, Pleas Koch, PA-C    Allergies    Compazine [prochlorperazine edisylate], Pineapple, and Tramadol  Review of Systems   Review of Systems  Skin:  Positive for rash.  All other systems reviewed and are negative.  Physical Exam Updated Vital Signs BP 132/74 (BP Location: Left Arm)   Pulse (!) 44   Temp 98.6 F (37 C) (  Oral)   Resp 16   SpO2 100%   Physical Exam Vitals and nursing note reviewed.  Constitutional:      General: She is not in acute distress.    Appearance: She is well-developed. She is not diaphoretic.  HENT:     Head: Normocephalic and atraumatic.  Cardiovascular:     Rate and Rhythm: Normal rate and regular rhythm.     Heart sounds: No murmur heard.   No friction rub. No gallop.  Pulmonary:     Effort: Pulmonary effort is normal. No respiratory distress.     Breath sounds: Normal breath sounds. No wheezing.  Abdominal:     General: Bowel sounds are normal. There is no distension.     Palpations: Abdomen is soft.     Tenderness: There is no abdominal tenderness.  Musculoskeletal:        General: Normal range of motion.     Cervical back: Normal range of motion and neck supple.  Skin:    General: Skin is warm and  dry.     Comments: There are small, occasional areas of erythema/raised skin that are noted, but no diffuse rash or urticarial lesions.  Neurological:     General: No focal deficit present.     Mental Status: She is alert and oriented to person, place, and time.    ED Results / Procedures / Treatments   Labs (all labs ordered are listed, but only abnormal results are displayed) Labs Reviewed - No data to display  EKG None  Radiology No results found.  Procedures Procedures   Medications Ordered in ED Medications  dexamethasone (DECADRON) injection 10 mg (has no administration in time range)  hydrOXYzine (ATARAX/VISTARIL) tablet 25 mg (has no administration in time range)    ED Course  I have reviewed the triage vital signs and the nursing notes.  Pertinent labs & imaging results that were available during my care of the patient were reviewed by me and considered in my medical decision making (see chart for details).    MDM Rules/Calculators/A&P  Patient presenting here with complaints of itching all over.  She denies any new contacts or exposures.  She has been seen here previously and prescribed Pepcid and prednisone with little relief.  Patient's vitals are stable with no hypotension, tachypnea, or stridor.  Patient given IM Decadron along with hydroxyzine and is now resting comfortably.  She appears to be feeling better.  She will be discharged with continued prednisone and Pepcid.  I will add hydroxyzine.  Final Clinical Impression(s) / ED Diagnoses Final diagnoses:  None    Rx / DC Orders ED Discharge Orders     None        Geoffery Lyons, MD 02/16/21 [phone]

## 2021-02-16 NOTE — ED Notes (Signed)
Patient discharge instructions reviewed with the patient. The patient verbalized understanding of instructions. Patient discharged. 

## 2021-02-16 NOTE — Discharge Instructions (Addendum)
Continue taking prednisone and Pepcid as before.  Begin taking hydroxyzine as prescribed as needed for itching.  Follow-up with primary doctor if symptoms or not improving in the next few days.

## 2021-02-16 NOTE — ED Triage Notes (Signed)
Pt returns to ED after been see 2 days ago for rash and itchiness that is not getting better with nothing taking at home. Pt able to talk on complete sentences, nad noticed.

## 2021-04-12 ENCOUNTER — Ambulatory Visit: Payer: Medicare Other | Admitting: Internal Medicine

## 2021-04-12 IMAGING — CR DG KNEE COMPLETE 4+V*R*
4 series · 4 of 4 positions shown · non-contrast
Comparison: 07/31/2019

CLINICAL DATA: Right knee pain for 3 months

EXAM:
RIGHT KNEE - COMPLETE 4+ VIEW

[w knee ap right]
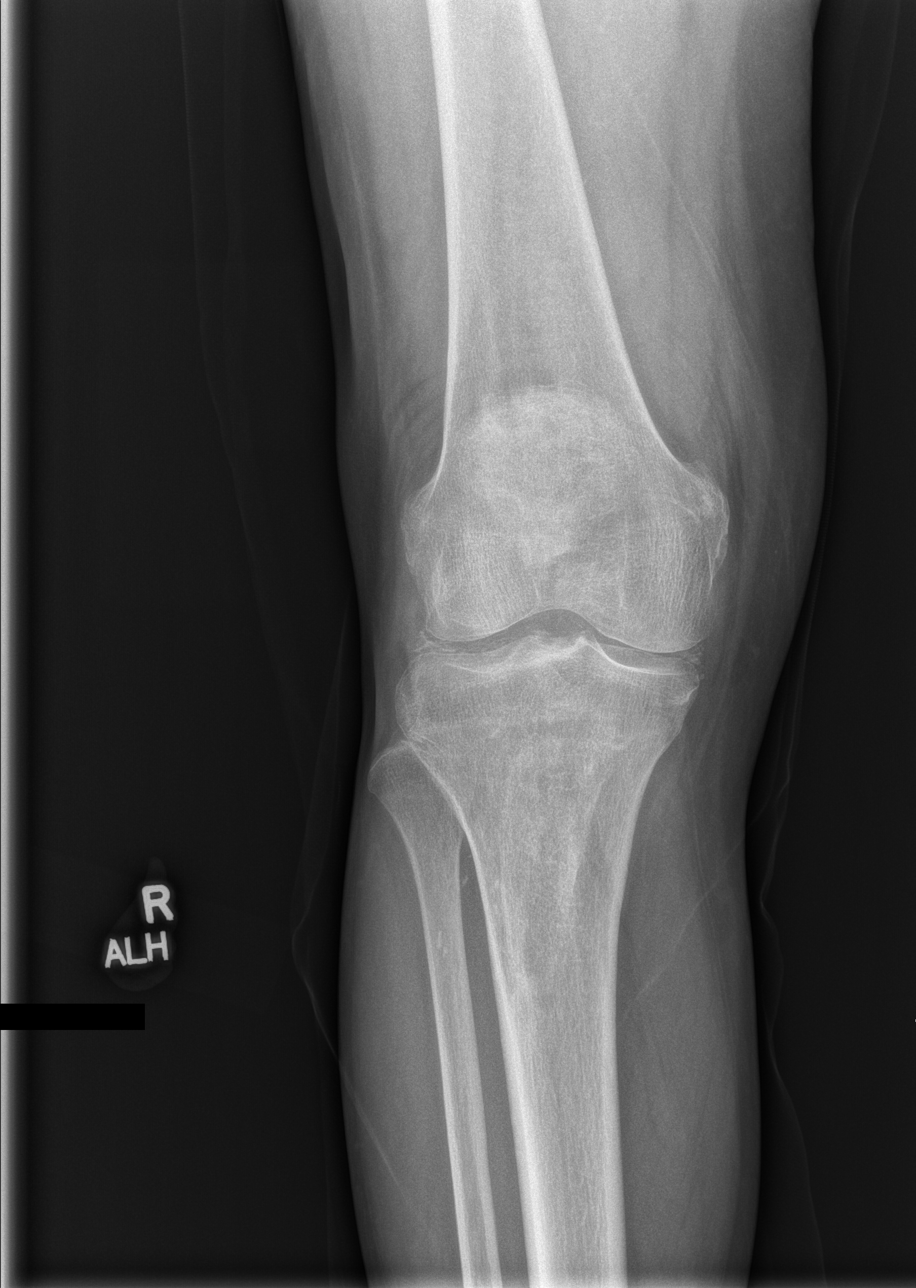

[w knee lat right]
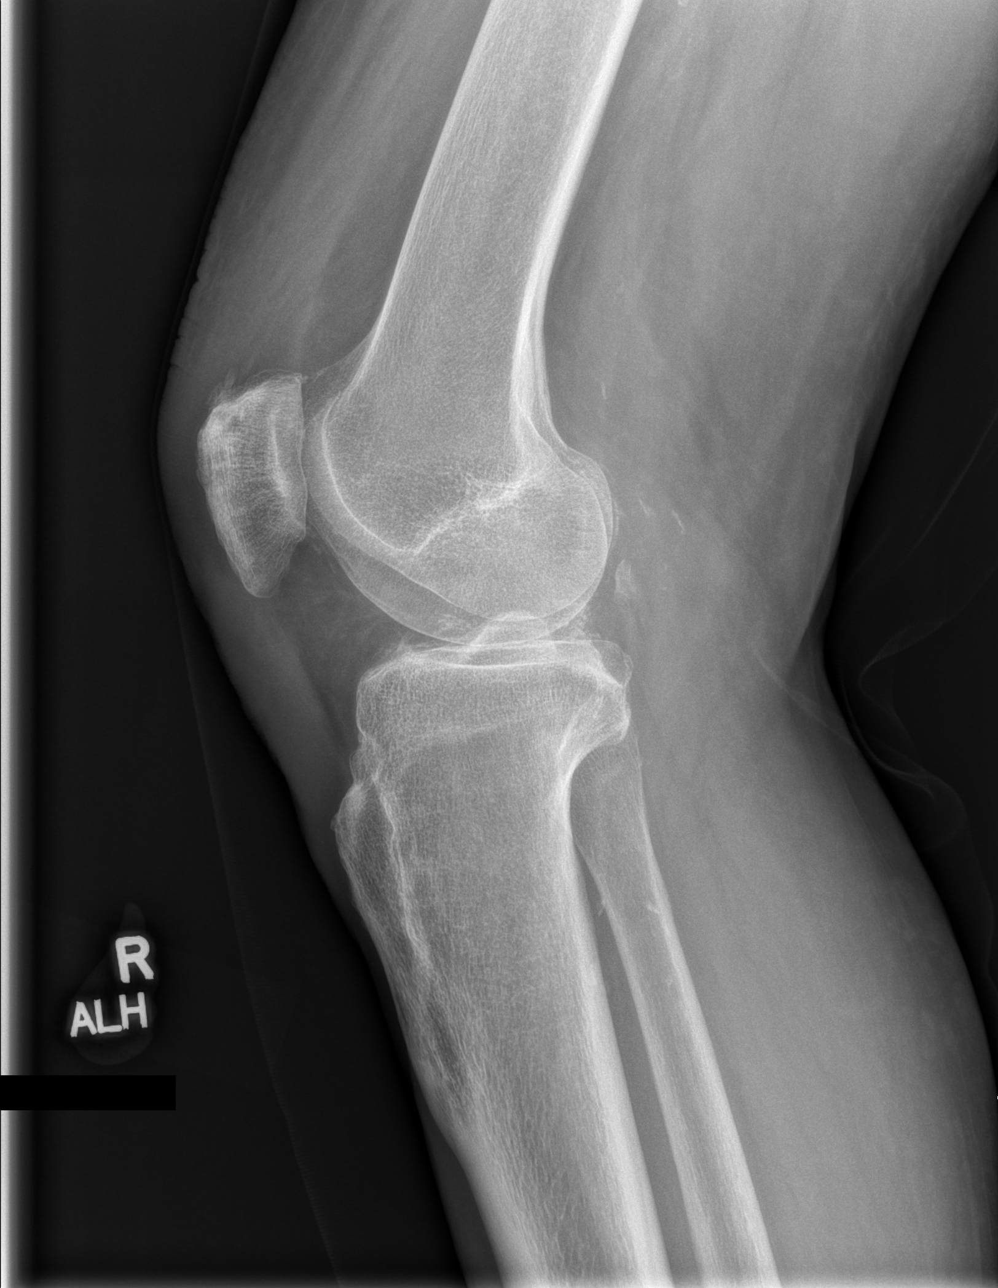

[w knee tunnel pa right]
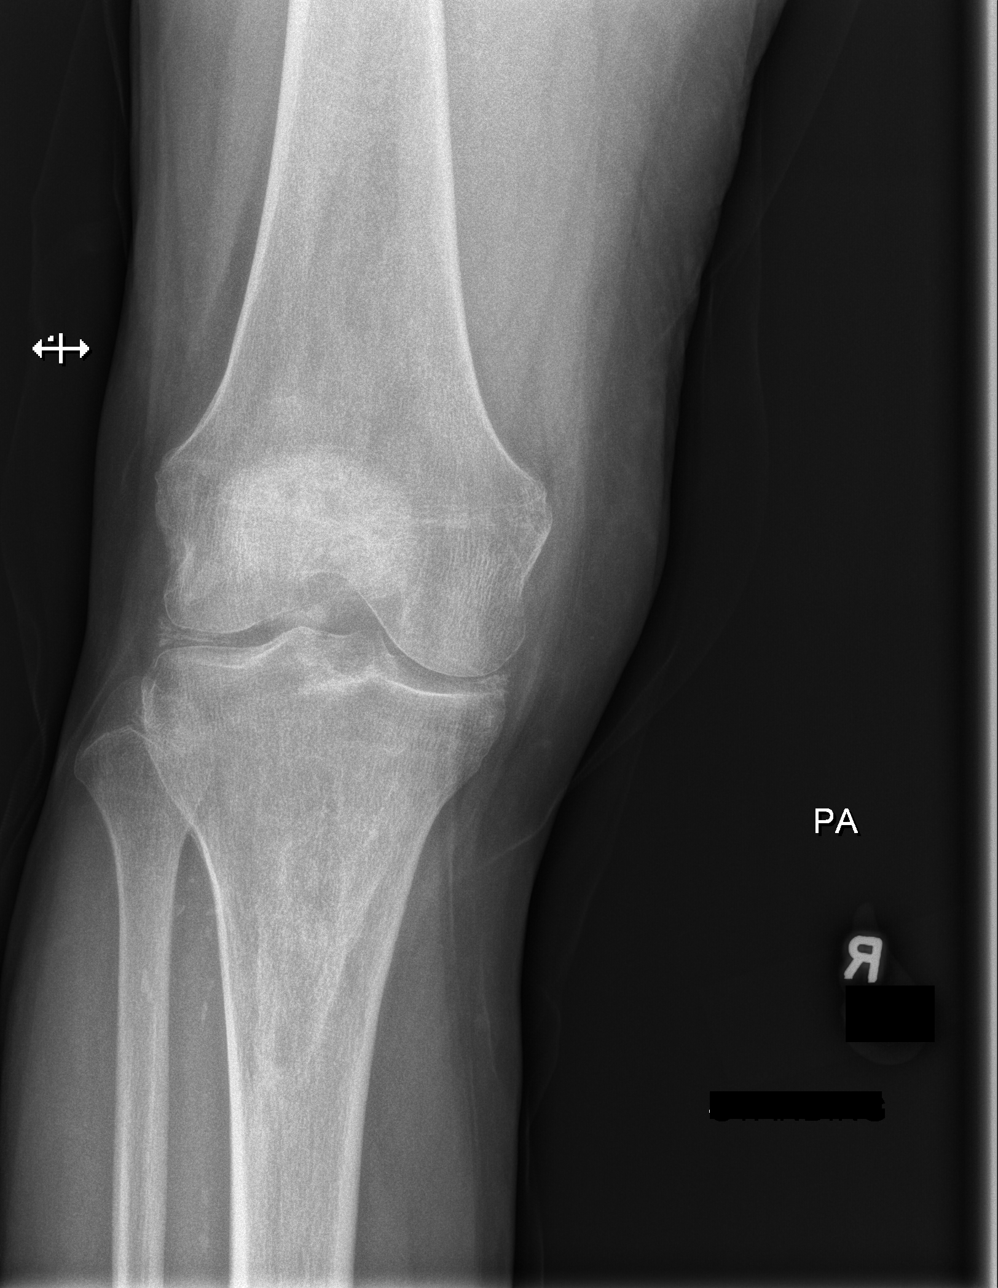

[x knee sunrise right]
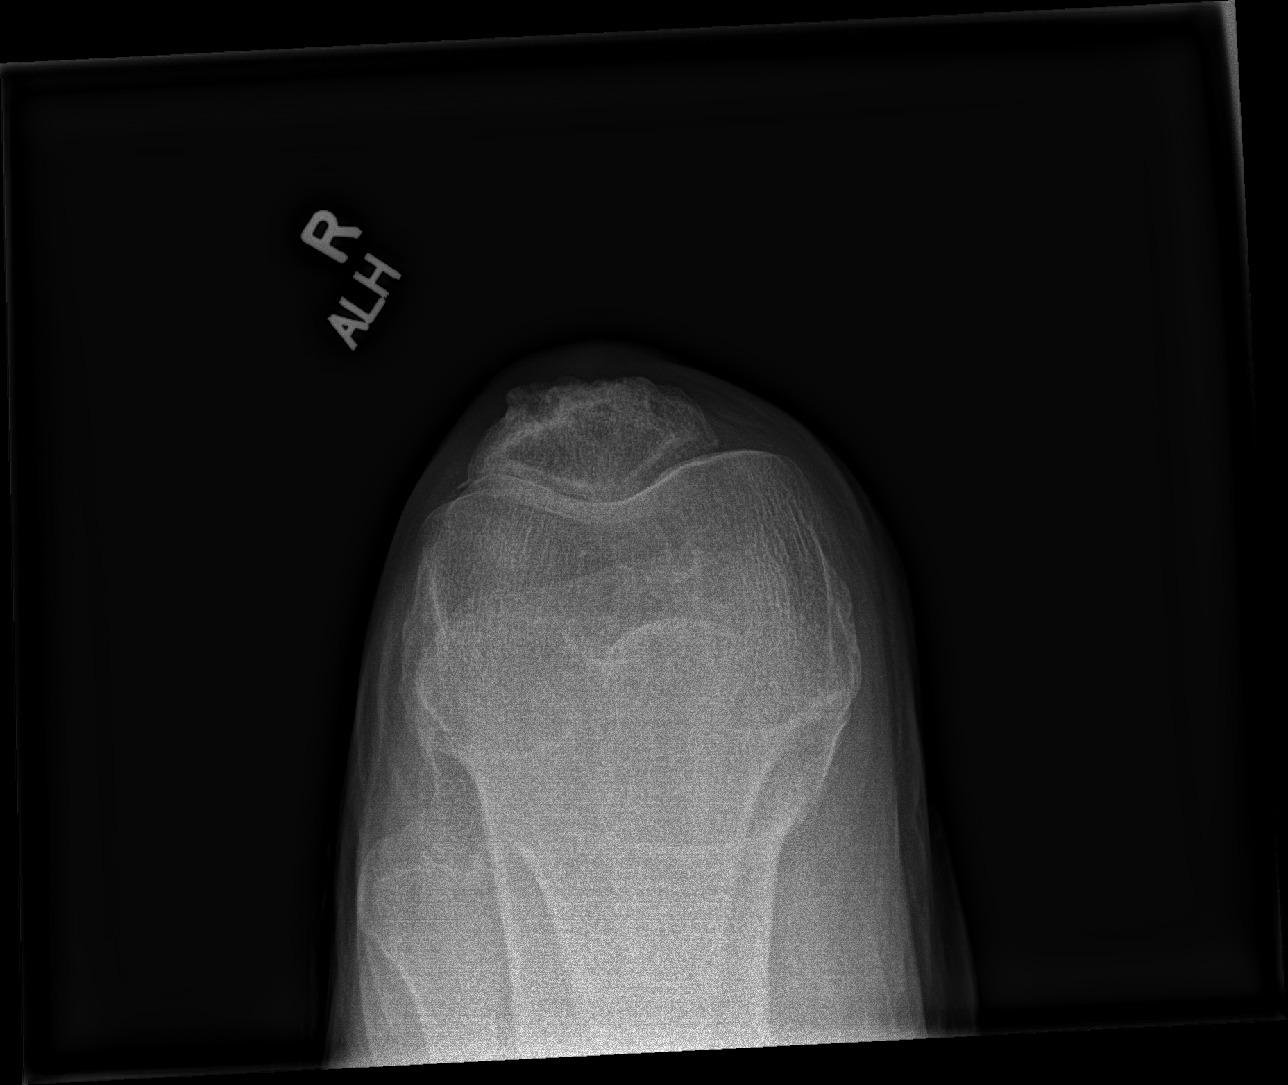

[4 of 4 positions shown; findings below may reference images not displayed]

FINDINGS: No acute fracture or dislocation. Mild tricompartmental joint space
narrowing. Chondrocalcinosis of the tibiofemoral compartments. No
knee joint effusion. Small enthesophyte at the quadriceps tendon
insertion. Scattered vascular calcifications. No focal soft tissue
swelling.
IMPRESSION: 1. No acute osseous abnormality, right knee.
2. Mild tricompartmental osteoarthrosis.

## 2022-10-30 ENCOUNTER — Encounter: Payer: Self-pay | Admitting: Podiatry

## 2022-10-30 ENCOUNTER — Ambulatory Visit (INDEPENDENT_AMBULATORY_CARE_PROVIDER_SITE_OTHER): Payer: Medicare Other | Admitting: Podiatry

## 2022-10-30 VITALS — BP 97/59 | HR 67

## 2022-10-30 DIAGNOSIS — D2372 Other benign neoplasm of skin of left lower limb, including hip: Secondary | ICD-10-CM | POA: Diagnosis not present

## 2022-10-30 DIAGNOSIS — D2371 Other benign neoplasm of skin of right lower limb, including hip: Secondary | ICD-10-CM | POA: Diagnosis not present

## 2022-10-30 DIAGNOSIS — B351 Tinea unguium: Secondary | ICD-10-CM

## 2022-10-30 DIAGNOSIS — M79672 Pain in left foot: Secondary | ICD-10-CM | POA: Diagnosis not present

## 2022-10-30 DIAGNOSIS — M79676 Pain in unspecified toe(s): Secondary | ICD-10-CM | POA: Diagnosis not present

## 2022-10-30 NOTE — Progress Notes (Signed)
Subjective:  Patient ID: Gina Higgins, female    DOB: 01-23-1946,  MRN: [id number] HPI Chief Complaint  Patient presents with   Skin Problem    Posterior heel bilateral - thick, discolored skin x several months, tried lotion, vaseline   Debridement    Requesting nail trim-hard for her to cut and thickness makes difficult   New Patient (Initial Visit)    77 y.o. female presents with the above complaint.   ROS: Denies fever chills nausea vomit muscle aches pains calf pain back pain chest pain shortness of breath.  Past Medical History:  Diagnosis Date   Arthritis    Heart murmur    History of chicken pox    Migraines    Past Surgical History:  Procedure Laterality Date   ABDOMINAL HYSTERECTOMY     Partial   APPENDECTOMY     BACK SURGERY     BREAST SURGERY     Total mastectomy   FOOT SURGERY     NECK SURGERY     REPLACEMENT TOTAL KNEE Right     Current Outpatient Medications:    atorvastatin (LIPITOR) 20 MG tablet, Take 20 mg by mouth daily., Disp: , Rfl:    oxyCODONE-acetaminophen (PERCOCET) 10-325 MG tablet, Take 1 tablet by mouth 4 (four) times daily as needed., Disp: , Rfl:    allopurinol (ZYLOPRIM) 100 MG tablet, Take 100 mg by mouth daily., Disp: , Rfl:    carisoprodol (SOMA) 350 MG tablet, Take 350 mg by mouth 3 (three) times daily as needed for muscle spasms., Disp: , Rfl:    famotidine (PEPCID) 20 MG tablet, Take 1 tablet (20 mg total) by mouth 2 (two) times daily., Disp: 30 tablet, Rfl: 0   hydrOXYzine (ATARAX/VISTARIL) 25 MG tablet, Take 1 tablet (25 mg total) by mouth every 6 (six) hours as needed., Disp: 15 tablet, Rfl: 0  Allergies  Allergen Reactions   Compazine [Prochlorperazine Edisylate]     Lock jaw   Pineapple Other (See Comments)    Lockjaw    Tramadol Itching   Review of Systems Objective:   Vitals:   10/30/22 0815  BP: (!) 97/59  Pulse: 67    General: Well developed, nourished, in no acute distress, alert and oriented x3    Dermatological: Skin is warm, dry and supple bilateral. Nails x 10 are thick yellow dystrophic clinically mycotic; remaining integument appears unremarkable at this time. There are no open sores, no preulcerative lesions, no rash or signs of infection present.  She has dry xerotic skin with tylomas to the posterior lateral aspect of the bilateral foot with hyperpigmentation  Vascular: Dorsalis Pedis artery and Posterior Tibial artery pedal pulses are 2/4 bilateral with immedate capillary fill time. Pedal hair growth present. No varicosities and no lower extremity edema present bilateral.   Neruologic: Grossly intact via light touch bilateral. Vibratory intact via tuning fork bilateral. Protective threshold with Semmes Wienstein monofilament intact to all pedal sites bilateral. Patellar and Achilles deep tendon reflexes 2+ bilateral. No Babinski or clonus noted bilateral.   Musculoskeletal: No gross boney pedal deformities bilateral. No pain, crepitus, or limitation noted with foot and ankle range of motion bilateral. Muscular strength 5/5 in all groups tested bilateral.  Gait: Unassisted, Nonantalgic.    Radiographs:  None taken  Assessment & Plan:   Assessment: Pain limb secondary to onychomycosis and benign skin lesions.  Plan: Debridement of nails and benign skin lesions.  Follow-up as needed.  Started her on AmLactin to be put on at  night under occlusion.  She will follow-up with Dr. Adah Perl.     Gina Higgins, [place]

## 2022-10-31 ENCOUNTER — Telehealth: Payer: Self-pay

## 2022-10-31 MED ORDER — AMMONIUM LACTATE 12 % EX LOTN: 1.0000 | TOPICAL_LOTION | CUTANEOUS | 5 refills | Status: AC | PRN

## 2022-10-31 NOTE — Telephone Encounter (Signed)
Error

## 2022-10-31 NOTE — Addendum Note (Signed)
Addended by: Clovis Riley E on: 10/31/2022 01:44 PM   Modules accepted: Orders

## 2023-03-05 ENCOUNTER — Ambulatory Visit (INDEPENDENT_AMBULATORY_CARE_PROVIDER_SITE_OTHER): Payer: Medicare Other | Admitting: Podiatry

## 2023-03-05 VITALS — BP 104/59

## 2023-03-05 DIAGNOSIS — D2372 Other benign neoplasm of skin of left lower limb, including hip: Secondary | ICD-10-CM

## 2023-03-05 DIAGNOSIS — D2371 Other benign neoplasm of skin of right lower limb, including hip: Secondary | ICD-10-CM | POA: Diagnosis not present

## 2023-03-05 DIAGNOSIS — M79676 Pain in unspecified toe(s): Secondary | ICD-10-CM | POA: Diagnosis not present

## 2023-03-05 DIAGNOSIS — B351 Tinea unguium: Secondary | ICD-10-CM | POA: Diagnosis not present

## 2023-03-10 ENCOUNTER — Encounter: Payer: Self-pay | Admitting: Podiatry

## 2023-03-10 NOTE — Progress Notes (Signed)
  Subjective:  Patient ID: Gina Higgins, female    DOB: [birth year],  MRN: [id number]  Gina Higgins presents to clinic today for follow up benign skin lesion b/l heels and painful mycotic toenails which limit ambulation   Chief Complaint  Patient presents with   Nail Problem    rfc   New problem(s): None.   PCP is Raymon Mutton., FNP.  Allergies  Allergen Reactions   Compazine [Prochlorperazine Edisylate]     Lock jaw   Pineapple Other (See Comments)    Lockjaw    Tramadol Itching    Review of Systems: Negative except as noted in the HPI.  Objective: No changes noted in today's physical examination. Vitals:   03/05/23 0924  BP: (!) 104/59   Gina Higgins is a pleasant 77 y.o. female thin build in NAD. AAO x 3. Vascular Examination: Capillary refill time immediate b/l. Vascular status intact b/l with palpable pedal pulses. Pedal hair absent b/l. No pain with calf compression b/l. Skin temperature gradient WNL b/l. No cyanosis or clubbing b/l. No ischemia or gangrene noted b/l.   Neurological Examination: Sensation grossly intact b/l with 10 gram monofilament. Vibratory sensation intact b/l.   Dermatological Examination: Pedal skin with normal turgor, texture and tone b/l.  No open wounds. No interdigital macerations.   Toenails 1-5 b/l thick, discolored, elongated with subungual debris and pain on dorsal palpation.   Hyperkeratotic lesion(s) posterolateral aspect of left heel and posterolateral aspect of right heel.  No erythema, no edema, no drainage, no fluctuance.  Musculoskeletal Examination: Normal muscle strength 5/5 to all lower extremity muscle groups bilaterally. No pain, crepitus or joint limitation noted with ROM b/l LE. No gross bony pedal deformities b/l. Patient ambulates independently without assistive aids.  Radiographs: None  Assessment/Plan: 1. Pain due to onychomycosis of toenail   2. Benign neoplasm of skin of left foot   3. Benign  neoplasm of skin of right foot     -Consent given for treatment as described below: -Examined patient. -Applied 3-WEA solution to heels. Debridement of benign neoplasm b/l heels. Continue regimen of Ammonium Lactate under occlusion as prescribed by Dr. Al Corpus. -Mycotic toenails 1-5 bilaterally were debrided in length and girth with sterile nail nippers and dremel without incident. -Patient/POA to call should there be question/concern in the interim.   Return in about 3 months (around 06/05/2023).  Freddie Breech, DPM

## 2023-06-11 ENCOUNTER — Ambulatory Visit: Payer: Medicare Other | Admitting: Podiatry

## 2023-06-20 ENCOUNTER — Encounter: Payer: Self-pay | Admitting: Podiatry

## 2023-06-20 ENCOUNTER — Ambulatory Visit (INDEPENDENT_AMBULATORY_CARE_PROVIDER_SITE_OTHER): Payer: Medicare Other | Admitting: Podiatry

## 2023-06-20 DIAGNOSIS — M79676 Pain in unspecified toe(s): Secondary | ICD-10-CM

## 2023-06-20 DIAGNOSIS — B351 Tinea unguium: Secondary | ICD-10-CM

## 2023-06-20 NOTE — Progress Notes (Signed)

## 2023-09-20 ENCOUNTER — Ambulatory Visit: Payer: Medicare Other | Admitting: Podiatry

## 2024-01-30 ENCOUNTER — Ambulatory Visit: Admitting: Podiatry
